# Patient Record
Sex: Male | Born: 1970 | Race: White | Hispanic: No | Marital: Married | Smoking: Never smoker
Health system: Southern US, Community
[De-identification: ages and names within clinical notes are randomized; demographics above are authoritative.]

## PROBLEM LIST (undated history)

## (undated) DIAGNOSIS — F988 Other specified behavioral and emotional disorders with onset usually occurring in childhood and adolescence: Secondary | ICD-10-CM

---

## 2014-01-21 ENCOUNTER — Encounter (HOSPITAL_COMMUNITY): Payer: Self-pay | Admitting: Adult Health

## 2014-01-21 ENCOUNTER — Inpatient Hospital Stay (HOSPITAL_COMMUNITY)
Admission: EM | Admit: 2014-01-21 | Discharge: 2014-01-23 | DRG: 084 | Disposition: A | Discharge status: Home / Self Care | Payer: BLUE CROSS/BLUE SHIELD | Attending: Neurosurgery | Admitting: Neurosurgery

## 2014-01-21 ENCOUNTER — Emergency Department (HOSPITAL_COMMUNITY): Payer: BLUE CROSS/BLUE SHIELD

## 2014-01-21 DIAGNOSIS — S02119A Unspecified fracture of occiput, initial encounter for closed fracture: Secondary | ICD-10-CM | POA: Diagnosis present

## 2014-01-21 DIAGNOSIS — S065XAA Traumatic subdural hemorrhage with loss of consciousness status unknown, initial encounter: Secondary | ICD-10-CM | POA: Diagnosis present

## 2014-01-21 DIAGNOSIS — S065X9A Traumatic subdural hemorrhage with loss of consciousness of unspecified duration, initial encounter: Secondary | ICD-10-CM | POA: Diagnosis not present

## 2014-01-21 DIAGNOSIS — W11XXXA Fall on and from ladder, initial encounter: Secondary | ICD-10-CM | POA: Diagnosis present

## 2014-01-21 DIAGNOSIS — F988 Other specified behavioral and emotional disorders with onset usually occurring in childhood and adolescence: Secondary | ICD-10-CM | POA: Diagnosis present

## 2014-01-21 DIAGNOSIS — W19XXXA Unspecified fall, initial encounter: Secondary | ICD-10-CM

## 2014-01-21 HISTORY — DX: Other specified behavioral and emotional disorders with onset usually occurring in childhood and adolescence: F98.8

## 2014-01-21 LAB — COMPREHENSIVE METABOLIC PANEL
ALBUMIN: 3.8 g/dL (ref 3.5–5.2)
ALK PHOS: 57 U/L (ref 39–117)
ALT: 20 U/L (ref 0–53)
AST: 28 U/L (ref 0–37)
Anion gap: 8 (ref 5–15)
BUN: 24 mg/dL — ABNORMAL HIGH (ref 6–23)
CO2: 26 mmol/L (ref 19–32)
CREATININE: 0.96 mg/dL (ref 0.50–1.35)
Calcium: 8.8 mg/dL (ref 8.4–10.5)
Chloride: 105 mEq/L (ref 96–112)
GFR calc non Af Amer: >90 mL/min (ref >90)
Glucose, Bld: 108 mg/dL — ABNORMAL HIGH (ref 70–99)
POTASSIUM: 3.4 mmol/L — AB (ref 3.5–5.1)
SODIUM: 139 mmol/L (ref 135–145)
Total Bilirubin: 0.9 mg/dL (ref 0.3–1.2)
Total Protein: 6.7 g/dL (ref 6.0–8.3)

## 2014-01-21 LAB — CBC WITH DIFFERENTIAL/PLATELET
Basophils Absolute: 0 10*3/uL (ref 0.0–0.1)
Basophils Relative: 0 % (ref 0–1)
EOS ABS: 0.2 10*3/uL (ref 0.0–0.7)
EOS PCT: 2 % (ref 0–5)
HCT: 41.2 % (ref 39.0–52.0)
HEMOGLOBIN: 14.1 g/dL (ref 13.0–17.0)
Lymphocytes Relative: 30 % (ref 12–46)
Lymphs Abs: 3.1 10*3/uL (ref 0.7–4.0)
MCH: 30.7 pg (ref 26.0–34.0)
MCHC: 34.2 g/dL (ref 30.0–36.0)
MCV: 89.8 fL (ref 78.0–100.0)
MONO ABS: 0.7 10*3/uL (ref 0.1–1.0)
MONOS PCT: 7 % (ref 3–12)
Neutro Abs: 6.1 10*3/uL (ref 1.7–7.7)
Neutrophils Relative %: 61 % (ref 43–77)
Platelets: 263 10*3/uL (ref 150–400)
RBC: 4.59 MIL/uL (ref 4.22–5.81)
RDW: 12.7 % (ref 11.5–15.5)
WBC: 10.1 10*3/uL (ref 4.0–10.5)

## 2014-01-21 LAB — I-STAT CG4 LACTIC ACID, ED: LACTIC ACID, VENOUS: 0.91 mmol/L (ref 0.5–2.2)

## 2014-01-21 MED ORDER — HYDROMORPHONE HCL 1 MG/ML IJ SOLN
0.5000 mg | INTRAMUSCULAR | Status: DC | PRN
  Administered 2014-01-22 (×4): 0.5 mg via INTRAVENOUS
  Filled 2014-01-21 (×4): qty 1

## 2014-01-21 MED ORDER — METOCLOPRAMIDE HCL 5 MG/ML IJ SOLN
10.0000 mg | Freq: Once | INTRAMUSCULAR | Status: AC
  Administered 2014-01-21: 10 mg via INTRAVENOUS
  Filled 2014-01-21: qty 2

## 2014-01-21 MED ORDER — ACETAMINOPHEN 325 MG PO TABS: 650.0000 mg | ORAL_TABLET | Freq: Four times a day (QID) | ORAL | Status: DC | PRN

## 2014-01-21 MED ORDER — POTASSIUM CHLORIDE IN NACL 20-0.9 MEQ/L-% IV SOLN
INTRAVENOUS | Status: DC
  Administered 2014-01-21: 23:00:00 via INTRAVENOUS
  Filled 2014-01-21 (×5): qty 1000

## 2014-01-21 MED ORDER — FENTANYL CITRATE 0.05 MG/ML IJ SOLN
25.0000 ug | Freq: Once | INTRAMUSCULAR | Status: AC
  Administered 2014-01-21: 25 ug via INTRAVENOUS
  Filled 2014-01-21: qty 2

## 2014-01-21 MED ORDER — ALUM & MAG HYDROXIDE-SIMETH 200-200-20 MG/5ML PO SUSP: 30.0000 mL | Freq: Four times a day (QID) | ORAL | Status: DC | PRN

## 2014-01-21 MED ORDER — DOCUSATE SODIUM 100 MG PO CAPS
100.0000 mg | ORAL_CAPSULE | Freq: Two times a day (BID) | ORAL | Status: DC
  Filled 2014-01-21 (×5): qty 1

## 2014-01-21 MED ORDER — HYDROCODONE-ACETAMINOPHEN 5-325 MG PO TABS
1.0000 | ORAL_TABLET | ORAL | Status: DC | PRN
  Administered 2014-01-22 – 2014-01-23 (×2): 2 via ORAL
  Filled 2014-01-21 (×2): qty 2

## 2014-01-21 MED ORDER — FENTANYL CITRATE 0.05 MG/ML IJ SOLN
INTRAMUSCULAR | Status: AC
  Filled 2014-01-21: qty 2

## 2014-01-21 MED ORDER — ONDANSETRON HCL 4 MG PO TABS: 4.0000 mg | ORAL_TABLET | Freq: Four times a day (QID) | ORAL | Status: DC | PRN

## 2014-01-21 MED ORDER — LORAZEPAM 2 MG/ML IJ SOLN
1.0000 mg | Freq: Once | INTRAMUSCULAR | Status: AC
  Administered 2014-01-21: 1 mg via INTRAVENOUS
  Filled 2014-01-21: qty 1

## 2014-01-21 MED ORDER — ACETAMINOPHEN 650 MG RE SUPP
650.0000 mg | Freq: Four times a day (QID) | RECTAL | Status: DC | PRN
Start: 2014-01-21 — End: 2014-01-23
  Filled 2014-01-21: qty 1

## 2014-01-21 MED ORDER — ONDANSETRON HCL 4 MG/2ML IJ SOLN
4.0000 mg | Freq: Four times a day (QID) | INTRAMUSCULAR | Status: DC | PRN
  Administered 2014-01-21 – 2014-01-23 (×4): 4 mg via INTRAVENOUS
  Filled 2014-01-21 (×5): qty 2

## 2014-01-21 MED ORDER — FENTANYL CITRATE 0.05 MG/ML IJ SOLN
50.0000 ug | Freq: Once | INTRAMUSCULAR | Status: AC
  Administered 2014-01-21: 50 ug via INTRAVENOUS

## 2014-01-21 NOTE — ED Notes (Signed)
Family at beside. Family given emotional support. 
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
## 2014-01-21 NOTE — ED Notes (Signed)
Presents post fall from 10 feet off a ladder, landed on carpeted concrete, initially confused, GCS of 14, still confused and c/o nausea, emesis x2.

## 2014-01-21 NOTE — H&P (Signed)
Mark Hanson is an 44 y.o. male.   Chief Complaint: Headache nausea vomiting HPI: Patient is a 44 year old gentleman who is on 8 foot stepladder [place] church [place] the legs gave way he fell back landing on the right side of his head on the floor of the church. Positive loss of consciousness for about 2 minutes then awoke confused with nausea and vomiting. Currently the patient's complaining of a headache and photophobia nausea vomiting but denies any numbness and tingling in his arms or his legs.  Past Medical History  Diagnosis Date  . ADD (attention deficit disorder)     History reviewed. No pertinent past surgical history.  History reviewed. No pertinent family history. Social History:  reports that he has never smoked. He does not have any smokeless tobacco history on file. He reports that he does not drink alcohol or use illicit drugs.  Allergies: No Known Allergies   (Not in a hospital admission)  Results for orders placed or performed during the hospital encounter of 01/21/14 (from the past 48 hour(s))  CBC with Differential     Status: None   Collection Time: 01/21/14  7:39 PM  Result Value Ref Range   WBC 10.1 4.0 - 10.5 K/uL   RBC 4.59 4.22 - 5.81 MIL/uL   Hemoglobin 14.1 13.0 - 17.0 g/dL   HCT 41.2 39.0 - 52.0 %   MCV 89.8 78.0 - 100.0 fL   MCH 30.7 26.0 - 34.0 pg   MCHC 34.2 30.0 - 36.0 g/dL   RDW 12.7 11.5 - 15.5 %   Platelets 263 150 - 400 K/uL   Neutrophils Relative % 61 43 - 77 %   Neutro Abs 6.1 1.7 - 7.7 K/uL   Lymphocytes Relative 30 12 - 46 %   Lymphs Abs 3.1 0.7 - 4.0 K/uL   Monocytes Relative 7 3 - 12 %   Monocytes Absolute 0.7 0.1 - 1.0 K/uL   Eosinophils Relative 2 0 - 5 %   Eosinophils Absolute 0.2 0.0 - 0.7 K/uL   Basophils Relative 0 0 - 1 %   Basophils Absolute 0.0 0.0 - 0.1 K/uL  Comprehensive metabolic panel     Status: Abnormal   Collection Time: 01/21/14  7:39 PM  Result Value Ref Range   Sodium 139 135 - 145 mmol/L    Comment: Please note  change in reference range.   Potassium 3.4 (L) 3.5 - 5.1 mmol/L    Comment: Please note change in reference range.   Chloride 105 96 - 112 mEq/L   CO2 26 19 - 32 mmol/L   Glucose, Bld 108 (H) 70 - 99 mg/dL   BUN 24 (H) 6 - 23 mg/dL   Creatinine, Ser 0.96 0.50 - 1.35 mg/dL   Calcium 8.8 8.4 - 10.5 mg/dL   Total Protein 6.7 6.0 - 8.3 g/dL   Albumin 3.8 3.5 - 5.2 g/dL   AST 28 0 - 37 U/L   ALT 20 0 - 53 U/L   Alkaline Phosphatase 57 39 - 117 U/L   Total Bilirubin 0.9 0.3 - 1.2 mg/dL   GFR calc non Af Amer >90 >90 mL/min   GFR calc Af Amer >90 >90 mL/min    Comment: (NOTE) The eGFR has been calculated using the CKD EPI equation. This calculation has not been validated in all clinical situations. eGFR's persistently <90 mL/min signify possible Chronic Kidney Disease.    Anion gap 8 5 - 15  I-Stat CG4 Lactic Acid, ED  Status: None   Collection Time: 01/21/14  7:44 PM  Result Value Ref Range   Lactic Acid, Venous 0.91 0.5 - 2.2 mmol/L   Ct Head Wo Contrast  01/21/2014   CLINICAL DATA:  Status post 10 foot fall off of a ladder. Confusion, nausea and vomiting. Initial encounter.  EXAM: CT HEAD WITHOUT CONTRAST  CT CERVICAL SPINE WITHOUT CONTRAST  TECHNIQUE: Multidetector CT imaging of the head and cervical spine was performed following the standard protocol without intravenous contrast. Multiplanar CT image reconstructions of the cervical spine were also generated.  COMPARISON:  None.  FINDINGS: CT HEAD FINDINGS  The patient has a nondisplaced skull fracture on the right. The fracture is seen inferiorly in the occipital bone and extends into the lambdoid suture which is diastased. The fracture continues cephalad into the right parietal bone. Overlying soft tissue swelling is noted. Small amount of hemorrhage is seen extending from the middle cranial fossa along the left convexities. The CSF space along the left parietal and temporal convexities is effaced. There is no pneumocephalus. No  evidence of infarct, mass lesion, mass effect, midline shift or abnormal extra-axial fluid collection is seen. There is no hydrocephalus.  CT CERVICAL SPINE FINDINGS  No fracture or malalignment of the cervical spine is identified. The patient has degenerative disc disease most notable at C4-5 and C5-6. No epidural hematoma is visualized. Lung apices are clear.  IMPRESSION: The study is positive for a nondisplaced skull fracture on the right extending from the occipital bone into the right parietal bone.  Small extra axial hemorrhage extending from the right middle cranial fossa along the right temporal and parietal convexities. There is no mass effect or midline shift.  No acute abnormality cervical spine.  Degenerative disc disease most notable C4-5 and C5-6.  Critical Value/emergent results were called by telephone at the time of interpretation on 01/21/2014 at 8:50 pm to Dr. Gareth Morgan , who verbally acknowledged these results.   Electronically Signed   By: Inge Rise M.D.   On: 01/21/2014 20:53   Ct Cervical Spine Wo Contrast  01/21/2014   CLINICAL DATA:  Status post 10 foot fall off of a ladder. Confusion, nausea and vomiting. Initial encounter.  EXAM: CT HEAD WITHOUT CONTRAST  CT CERVICAL SPINE WITHOUT CONTRAST  TECHNIQUE: Multidetector CT imaging of the head and cervical spine was performed following the standard protocol without intravenous contrast. Multiplanar CT image reconstructions of the cervical spine were also generated.  COMPARISON:  None.  FINDINGS: CT HEAD FINDINGS  The patient has a nondisplaced skull fracture on the right. The fracture is seen inferiorly in the occipital bone and extends into the lambdoid suture which is diastased. The fracture continues cephalad into the right parietal bone. Overlying soft tissue swelling is noted. Small amount of hemorrhage is seen extending from the middle cranial fossa along the left convexities. The CSF space along the left parietal and temporal  convexities is effaced. There is no pneumocephalus. No evidence of infarct, mass lesion, mass effect, midline shift or abnormal extra-axial fluid collection is seen. There is no hydrocephalus.  CT CERVICAL SPINE FINDINGS  No fracture or malalignment of the cervical spine is identified. The patient has degenerative disc disease most notable at C4-5 and C5-6. No epidural hematoma is visualized. Lung apices are clear.  IMPRESSION: The study is positive for a nondisplaced skull fracture on the right extending from the occipital bone into the right parietal bone.  Small extra axial hemorrhage extending from the right middle  cranial fossa along the right temporal and parietal convexities. There is no mass effect or midline shift.  No acute abnormality cervical spine.  Degenerative disc disease most notable C4-5 and C5-6.  Critical Value/emergent results were called by telephone at the time of interpretation on 01/21/2014 at 8:50 pm to Dr. Gareth Morgan , who verbally acknowledged these results.   Electronically Signed   By: Inge Rise M.D.   On: 01/21/2014 20:53   Dg Pelvis Portable  01/21/2014   CLINICAL DATA:  Fall 10 feet from a ladder onto carpeted concrete. Level 2 trauma.  EXAM: PORTABLE PELVIS 1-2 VIEWS  COMPARISON:  None.  FINDINGS: Bilaterally symmetric sclerosis along the sacroiliac joints with mild SI joint narrowing compatible with bilateral sacroiliitis. Clips along the upper scrotum, probably from vasectomy.  No acute bony findings.  IMPRESSION: 1. Bilateral symmetric sacroiliitis. This can sometimes be associated with ankylosing spondylitis or inflammatory bowel disease. 2. No acute findings.   Electronically Signed   By: Sherryl Barters M.D.   On: 01/21/2014 19:51   Dg Chest Portable 1 View  01/21/2014   CLINICAL DATA:  Fall 10 feet from a ladder onto carpeted concrete. Level 2 trauma.  EXAM: PORTABLE CHEST - 1 VIEW  COMPARISON:  None.  FINDINGS: The heart size and mediastinal contours are  within normal limits. Both lungs are clear. The visualized skeletal structures are unremarkable.  IMPRESSION: No active disease.   Electronically Signed   By: Sherryl Barters M.D.   On: 01/21/2014 19:49    Review of Systems  Unable to perform ROS   Blood pressure 107/43, pulse 77, temperature 97.6 F (36.4 C), resp. rate 19, SpO2 95 %. Physical Exam  Constitutional: He appears well-developed and well-nourished. He appears lethargic.  HENT:  Head: Normocephalic.  Eyes: Pupils are equal, round, and reactive to light.  Neck: Normal range of motion.  Cardiovascular: Normal rate.   Respiratory: Effort normal.  GI: Soft.  Neurological: He has normal strength. He appears lethargic. GCS eye subscore is 3. GCS verbal subscore is 4. GCS motor subscore is 6.  Patient is lethargic and arousable but oriented 3 pupils are equal and reactive extraocular movements are intact cranial nerves are otherwise intact strength is 5 out of 5 in his upper and lower extremities with no sensory loss reflexes are normal and symmetric  Skin: Skin is warm and dry.     Assessment/Plan 44 year old gentleman with a closed head injury right parietal occipital skull fracture very small amount of extra-axial blood on the left with a contrecoup contusion in for left temporal there is some effacement of the sulci on the left all contralateral to his skull fracture on the right and therefore motor more than likely contrecoup injuries. Patient is significantly postconcussive. We will put in the ICU for observation on IV fluids keep him nothing by mouth repeat CT in the morning. We'll continue c-collar until the patient's more awake to clear.  Louie Flenner P 01/21/2014, 9:42 PM

## 2014-01-22 ENCOUNTER — Observation Stay (HOSPITAL_COMMUNITY): Payer: BLUE CROSS/BLUE SHIELD

## 2014-01-22 ENCOUNTER — Encounter (HOSPITAL_COMMUNITY): Payer: Self-pay | Admitting: Radiology

## 2014-01-22 DIAGNOSIS — F988 Other specified behavioral and emotional disorders with onset usually occurring in childhood and adolescence: Secondary | ICD-10-CM | POA: Diagnosis present

## 2014-01-22 DIAGNOSIS — S02119A Unspecified fracture of occiput, initial encounter for closed fracture: Secondary | ICD-10-CM | POA: Diagnosis present

## 2014-01-22 DIAGNOSIS — S065X9A Traumatic subdural hemorrhage with loss of consciousness of unspecified duration, initial encounter: Secondary | ICD-10-CM | POA: Diagnosis present

## 2014-01-22 DIAGNOSIS — W11XXXA Fall on and from ladder, initial encounter: Secondary | ICD-10-CM | POA: Diagnosis present

## 2014-01-22 LAB — URINALYSIS, ROUTINE W REFLEX MICROSCOPIC
Bilirubin Urine: NEGATIVE
Glucose, UA: NEGATIVE mg/dL
Hgb urine dipstick: NEGATIVE
Ketones, ur: >80 mg/dL — AB
LEUKOCYTES UA: NEGATIVE
NITRITE: NEGATIVE
Protein, ur: NEGATIVE mg/dL
SPECIFIC GRAVITY, URINE: 1.027 (ref 1.005–1.030)
UROBILINOGEN UA: 0.2 mg/dL (ref 0.0–1.0)
pH: 6 (ref 5.0–8.0)

## 2014-01-22 LAB — MRSA PCR SCREENING: MRSA BY PCR: NEGATIVE

## 2014-01-22 MED ORDER — DEXAMETHASONE SODIUM PHOSPHATE 4 MG/ML IJ SOLN
8.0000 mg | Freq: Four times a day (QID) | INTRAMUSCULAR | Status: DC
  Administered 2014-01-22 – 2014-01-23 (×6): 8 mg via INTRAVENOUS
  Filled 2014-01-22 (×9): qty 2

## 2014-01-22 NOTE — Progress Notes (Signed)
Patient ID: Mark Hanson, male   DOB: 01/12/1971, 44 y.o.   MRN: [id number] Patient unchanged with headache nausea and vomiting  Neurologically intact and nonfocal somnolent but easily arousable  Head CT stable continue observation the ICU for

## 2014-01-22 NOTE — Progress Notes (Signed)
UR completed 

## 2014-01-22 NOTE — Progress Notes (Signed)
Chaplain responded to ED for level two trauma of a fall from ladder.  Chaplain was unable to see patient at time of arrival to hospital due to his receiving medical intervention by the staff.  Upon follow-up was able to meet patient and family members to offer pastoral support and encouragement.  Patient was transferred to [facility] as referred from a medical consult. Chaplain will continue to follow-up with patient and family for support and Unit Chaplain will be informed of patients admission. On-Call Chaplain [phone]

## 2014-01-22 NOTE — ED Provider Notes (Signed)
CSN: [id number]     Arrival date & time 01/21/14  1924 History   First MD Initiated Contact with Patient 01/21/14 1954     Chief Complaint  Patient presents with  . Fall     (Consider location/radiation/quality/duration/timing/severity/associated sxs/prior Treatment) HPI Comments: Tripped and fell off ladder 10 feet, head impact onto carpeted cement floor per bystander, LOC for .  Patient is a 44 y.o. male presenting with fall.  Fall This is a new problem. The current episode started today. The problem has been gradually improving. Associated symptoms include headaches, nausea and vomiting. Pertinent negatives include no abdominal pain, chest pain, congestion, coughing, fever, neck pain, rash, sore throat or visual change. Nothing aggravates the symptoms. He has tried nothing for the symptoms. The treatment provided no relief.    Past Medical History  Diagnosis Date  . ADD (attention deficit disorder)    History reviewed. No pertinent past surgical history. History reviewed. No pertinent family history. History  Substance Use Topics  . Smoking status: Never Smoker   . Smokeless tobacco: Not on file  . Alcohol Use: No    Review of Systems  Constitutional: Negative for fever.  HENT: Negative for congestion and sore throat.   Eyes: Negative for visual disturbance.  Respiratory: Negative for cough and shortness of breath.   Cardiovascular: Negative for chest pain.  Gastrointestinal: Positive for nausea and vomiting. Negative for abdominal pain.  Genitourinary: Negative for difficulty urinating.  Musculoskeletal: Negative for back pain, neck pain and neck stiffness.  Skin: Negative for rash.  Neurological: Positive for headaches. Negative for syncope.      Allergies  Review of patient's allergies indicates no known allergies.  Home Medications   Prior to Admission medications   Medication Sig Start Date End Date Taking? Authorizing Provider  Glucosamine HCl  (GLUCOSAMINE PO) Take 2 tablets by mouth daily.   Yes Historical Provider, MD  Omega-3 Fatty Acids (FISH OIL PO) Take 2 capsules by mouth daily.   Yes Historical Provider, MD  sertraline (ZOLOFT) 50 MG tablet Take 50 mg by mouth daily. 01/10/14  Yes Historical Provider, MD   BP 127/70 mmHg  Pulse 75  Temp(Src) 97.9 F (36.6 C) (Oral)  Resp 17  SpO2 97% Physical Exam  Constitutional: He is oriented to person, place, and time. He appears well-developed and well-nourished. No distress. Cervical collar and backboard in place.  HENT:  Head: Normocephalic. Head is with abrasion. Head is without raccoon's eyes.  Right Ear: External ear normal. No hemotympanum.  Left Ear: External ear normal. No hemotympanum.  Nose: No nasal septal hematoma. No epistaxis.  Mouth/Throat: Normal dentition.  Small abrasion to his forehead  Eyes: Conjunctivae and EOM are normal. Pupils are equal, round, and reactive to light.  Neck: Normal range of motion.  Cardiovascular: Normal rate, regular rhythm, normal heart sounds and intact distal pulses.  Exam reveals no gallop and no friction rub.   No murmur heard. Pulmonary/Chest: Effort normal and breath sounds normal. No respiratory distress. He has no wheezes. He has no rales. He exhibits no tenderness.  Abdominal: Soft. He exhibits no distension. There is no tenderness. There is no guarding.  Musculoskeletal: He exhibits no edema or tenderness.       Right hip: He exhibits normal strength.       Left hip: He exhibits normal strength.       Cervical back: He exhibits no bony tenderness.       Thoracic back: He exhibits no bony tenderness.  Lumbar back: He exhibits no bony tenderness.  Neurological: He is alert and oriented to person, place, and time.  Skin: Skin is warm and dry. He is not diaphoretic.  Nursing note and vitals reviewed.   ED Course  Procedures (including critical care time) Labs Review Labs Reviewed  COMPREHENSIVE METABOLIC PANEL -  Abnormal; Notable for the following:    Potassium 3.4 (*)    Glucose, Bld 108 (*)    BUN 24 (*)    All other components within normal limits  URINALYSIS, ROUTINE W REFLEX MICROSCOPIC - Abnormal; Notable for the following:    Ketones, ur >80 (*)    All other components within normal limits  MRSA PCR SCREENING  CBC WITH DIFFERENTIAL  I-STAT CG4 LACTIC ACID, ED    Imaging Review Ct Head Wo Contrast  01/21/2014   CLINICAL DATA:  Status post 10 foot fall off of a ladder. Confusion, nausea and vomiting. Initial encounter.  EXAM: CT HEAD WITHOUT CONTRAST  CT CERVICAL SPINE WITHOUT CONTRAST  TECHNIQUE: Multidetector CT imaging of the head and cervical spine was performed following the standard protocol without intravenous contrast. Multiplanar CT image reconstructions of the cervical spine were also generated.  COMPARISON:  None.  FINDINGS: CT HEAD FINDINGS  The patient has a nondisplaced skull fracture on the right. The fracture is seen inferiorly in the occipital bone and extends into the lambdoid suture which is diastased. The fracture continues cephalad into the right parietal bone. Overlying soft tissue swelling is noted. Small amount of hemorrhage is seen extending from the middle cranial fossa along the left convexities. The CSF space along the left parietal and temporal convexities is effaced. There is no pneumocephalus. No evidence of infarct, mass lesion, mass effect, midline shift or abnormal extra-axial fluid collection is seen. There is no hydrocephalus.  CT CERVICAL SPINE FINDINGS  No fracture or malalignment of the cervical spine is identified. The patient has degenerative disc disease most notable at C4-5 and C5-6. No epidural hematoma is visualized. Lung apices are clear.  IMPRESSION: The study is positive for a nondisplaced skull fracture on the right extending from the occipital bone into the right parietal bone.  Small extra axial hemorrhage extending from the right middle cranial fossa  along the right temporal and parietal convexities. There is no mass effect or midline shift.  No acute abnormality cervical spine.  Degenerative disc disease most notable C4-5 and C5-6.  Critical Value/emergent results were called by telephone at the time of interpretation on 01/21/2014 at 8:50 pm to Dr. Alvira Monday , who verbally acknowledged these results.   Electronically Signed   By: Drusilla Kanner M.D.   On: 01/21/2014 20:53   Ct Cervical Spine Wo Contrast  01/21/2014   CLINICAL DATA:  Status post 10 foot fall off of a ladder. Confusion, nausea and vomiting. Initial encounter.  EXAM: CT HEAD WITHOUT CONTRAST  CT CERVICAL SPINE WITHOUT CONTRAST  TECHNIQUE: Multidetector CT imaging of the head and cervical spine was performed following the standard protocol without intravenous contrast. Multiplanar CT image reconstructions of the cervical spine were also generated.  COMPARISON:  None.  FINDINGS: CT HEAD FINDINGS  The patient has a nondisplaced skull fracture on the right. The fracture is seen inferiorly in the occipital bone and extends into the lambdoid suture which is diastased. The fracture continues cephalad into the right parietal bone. Overlying soft tissue swelling is noted. Small amount of hemorrhage is seen extending from the middle cranial fossa along the left convexities.  The CSF space along the left parietal and temporal convexities is effaced. There is no pneumocephalus. No evidence of infarct, mass lesion, mass effect, midline shift or abnormal extra-axial fluid collection is seen. There is no hydrocephalus.  CT CERVICAL SPINE FINDINGS  No fracture or malalignment of the cervical spine is identified. The patient has degenerative disc disease most notable at C4-5 and C5-6. No epidural hematoma is visualized. Lung apices are clear.  IMPRESSION: The study is positive for a nondisplaced skull fracture on the right extending from the occipital bone into the right parietal bone.  Small extra axial  hemorrhage extending from the right middle cranial fossa along the right temporal and parietal convexities. There is no mass effect or midline shift.  No acute abnormality cervical spine.  Degenerative disc disease most notable C4-5 and C5-6.  Critical Value/emergent results were called by telephone at the time of interpretation on 01/21/2014 at 8:50 pm to Dr. Alvira MondayERIN Jaedyn Marrufo , who verbally acknowledged these results.   Electronically Signed   By: Drusilla Kannerhomas  Dalessio M.D.   On: 01/21/2014 20:53   Dg Pelvis Portable  01/21/2014   CLINICAL DATA:  Fall 10 feet from a ladder onto carpeted concrete. Level 2 trauma.  EXAM: PORTABLE PELVIS 1-2 VIEWS  COMPARISON:  None.  FINDINGS: Bilaterally symmetric sclerosis along the sacroiliac joints with mild SI joint narrowing compatible with bilateral sacroiliitis. Clips along the upper scrotum, probably from vasectomy.  No acute bony findings.  IMPRESSION: 1. Bilateral symmetric sacroiliitis. This can sometimes be associated with ankylosing spondylitis or inflammatory bowel disease. 2. No acute findings.   Electronically Signed   By: Herbie BaltimoreWalt  Liebkemann M.D.   On: 01/21/2014 19:51   Dg Chest Portable 1 View  01/21/2014   CLINICAL DATA:  Fall 10 feet from a ladder onto carpeted concrete. Level 2 trauma.  EXAM: PORTABLE CHEST - 1 VIEW  COMPARISON:  None.  FINDINGS: The heart size and mediastinal contours are within normal limits. Both lungs are clear. The visualized skeletal structures are unremarkable.  IMPRESSION: No active disease.   Electronically Signed   By: Herbie BaltimoreWalt  Liebkemann M.D.   On: 01/21/2014 19:49     EKG Interpretation None      MDM   Final diagnoses:  Fall  SDH (subdural hematoma)   44 year old male with no significant medical history presents as a Level II Trauma for concern of fall from 10 foot ladder with LOC.  Per bystanders pt had 5min LOC, and with EMS was GCS 14 and nauseas.   Per history provided by witness, he fell directly onto right side of head and  had little impact with other parts of body.  Patient GCS of 15 on arrival to the ED, however concerned regarding headache.  Airway/breathing intact and VS WNL.  Portable CXR and pelvis without signs of fracture.  Have low suspicion for intrathoracic, intraabdominal, pelvic or intraspinal injury given history and examination.  Labs also do not indicate other injuries.w  Given mechanism and LOC CT Head and Cervical Spine were ordered which were significant for a skull fracture and small amount of axial hemorrhage.  Neurosurgery was consulted and saw pt.  He was given zofran, reglan, ativan for continued nausea/vomiting.  He was given fentanyl for pain.     Rhae LernerErin Elizabeth Othell Diluzio, MD 01/22/14 0319  Merrie RoofJohn David Wofford III, MD 01/24/14 [phone]

## 2014-01-22 NOTE — ED Provider Notes (Signed)
I saw and evaluated the patient, reviewed the resident's note and I agree with the findings and plan.   EKG Interpretation None      CRITICAL CARE Performed by: Merrie RoofWOFFORD III, Carnel Stegman David   Total critical care time: 30   Critical care time was exclusive of separately billable procedures and treating other patients.  Critical care was necessary to treat or prevent imminent or life-threatening deterioration.  Critical care was time spent personally by me on the following activities: development of treatment plan with patient and/or surrogate as well as nursing, discussions with consultants, evaluation of patient's response to treatment, examination of patient, obtaining history from patient or surrogate, ordering and performing treatments and interventions, ordering and review of laboratory studies, ordering and review of radiographic studies, pulse oximetry and re-evaluation of patient's condition.   44 year old male presenting as a level II trauma code after he fell from height striking his head or losing consciousness.  On exam, alert, on backboard with c-collar, slow to answer questions, but oriented, lungs clear to auscultation bilaterally, heart sounds normal with regular rate and rhythm.  Imaging showed nondisplaced skull fracture and small amount of extra-axial hemorrhage. No other injuries identified by history or exam. Admitted by neurosurgery.  Clinical Impression: 1. Fall   2. SDH (subdural hematoma)    skull fracture    Candyce ChurnJohn David Muadh Creasy III, MD 01/22/14 [phone]

## 2014-01-23 MED ORDER — PROMETHAZINE HCL 12.5 MG PO TABS: 12.5000 mg | ORAL_TABLET | Freq: Four times a day (QID) | ORAL | Status: DC | PRN

## 2014-01-23 MED ORDER — HYDROCODONE-ACETAMINOPHEN 5-325 MG PO TABS
1.0000 | ORAL_TABLET | ORAL | Status: DC | PRN
Start: 2014-01-23 — End: 2018-07-02

## 2014-01-23 MED ORDER — PROMETHAZINE HCL 25 MG PO TABS
12.5000 mg | ORAL_TABLET | Freq: Four times a day (QID) | ORAL | Status: DC | PRN
  Administered 2014-01-23: 12.5 mg via ORAL
  Filled 2014-01-23: qty 1

## 2014-01-23 NOTE — Discharge Instructions (Signed)
Avoid all physical activity no lifting no driving remain out of work 1 week until follow-up. Minimize television watching no videogames minimize social media

## 2014-01-23 NOTE — Progress Notes (Signed)
Patient ID: Mark Hanson Situ, male   DOB: 10/18/1970, 44 y.o.   MRN: [id number] Patient doing much better no nausea no vomiting minimal headache  Awake alert oriented strength out of 5  Continue to observe make sure he tolerates (morning endplates around the unit can be discharged later today

## 2014-01-23 NOTE — Discharge Summary (Signed)
  Physician Discharge Summary  Patient ID: Mark Hanson MRN: [id number] DOB/AGE: 45/01/1970 45 y.o.  Admit date: 01/21/2014 Discharge date: 01/23/2014  Admission Diagnoses: Closed head injury concussion  Discharge Diagnoses: Samegood Active Problems:   SDH (subdural hematoma)   Discharged Condition: good  Hospital Course: Patient admitted hospital underwent a CT scan that showed skull fracture and contrecoup small subdural hematoma and hemorrhagic contusion. Patient was observed in the ICU and patient was stable for discharge home on hospital day 2 as long as he tolerates is by mouth breakfast and lunch as long as he keeps that down and ambulates around the unit  Consults: Significant Diagnostic Studies: Treatments: Discharge Exam: Blood pressure 108/48, pulse 52, temperature 98.2 F (36.8 C), temperature source Oral, resp. rate 16, SpO2 98 %. Awake alert oriented pupils equal strength 5out of 5  Disposition: Home     Medication List    TAKE these medications        FISH OIL PO  Take 2 capsules by mouth daily.     GLUCOSAMINE PO  Take 2 tablets by mouth daily.     HYDROcodone-acetaminophen 5-325 MG per tablet  Commonly known as:  NORCO/VICODIN  Take 1-2 tablets by mouth every 4 (four) hours as needed for moderate pain.     sertraline 50 MG tablet  Commonly known as:  ZOLOFT  Take 50 mg by mouth daily.           Follow-up Information    [facility]amez Arrona P, MD.   Specialty:  Neurosurgery   Contact information:   [address] [place] [address] [phone]       Signed: Mariam DollarCRAM,Halla Chopp P 01/23/2014, 7:51 AM

## 2014-01-27 ENCOUNTER — Emergency Department (HOSPITAL_COMMUNITY): Payer: BLUE CROSS/BLUE SHIELD

## 2014-01-27 ENCOUNTER — Emergency Department (HOSPITAL_COMMUNITY)
Admission: EM | Admit: 2014-01-27 | Discharge: 2014-01-27 | Disposition: A | Discharge status: Home / Self Care | Payer: BLUE CROSS/BLUE SHIELD | Attending: Emergency Medicine | Admitting: Emergency Medicine

## 2014-01-27 ENCOUNTER — Encounter (HOSPITAL_COMMUNITY): Payer: Self-pay | Admitting: Emergency Medicine

## 2014-01-27 DIAGNOSIS — Z79899 Other long term (current) drug therapy: Secondary | ICD-10-CM | POA: Insufficient documentation

## 2014-01-27 DIAGNOSIS — S065XAA Traumatic subdural hemorrhage with loss of consciousness status unknown, initial encounter: Secondary | ICD-10-CM

## 2014-01-27 DIAGNOSIS — W11XXXD Fall on and from ladder, subsequent encounter: Secondary | ICD-10-CM | POA: Diagnosis not present

## 2014-01-27 DIAGNOSIS — R112 Nausea with vomiting, unspecified: Secondary | ICD-10-CM | POA: Diagnosis not present

## 2014-01-27 DIAGNOSIS — R42 Dizziness and giddiness: Secondary | ICD-10-CM | POA: Diagnosis not present

## 2014-01-27 DIAGNOSIS — R51 Headache: Secondary | ICD-10-CM | POA: Diagnosis present

## 2014-01-27 DIAGNOSIS — T1490XA Injury, unspecified, initial encounter: Secondary | ICD-10-CM

## 2014-01-27 DIAGNOSIS — S065X9A Traumatic subdural hemorrhage with loss of consciousness of unspecified duration, initial encounter: Secondary | ICD-10-CM

## 2014-01-27 DIAGNOSIS — Z8659 Personal history of other mental and behavioral disorders: Secondary | ICD-10-CM | POA: Insufficient documentation

## 2014-01-27 DIAGNOSIS — S065X0D Traumatic subdural hemorrhage without loss of consciousness, subsequent encounter: Secondary | ICD-10-CM | POA: Diagnosis not present

## 2014-01-27 LAB — CBC
HEMATOCRIT: 50.1 % (ref 39.0–52.0)
Hemoglobin: 17.6 g/dL — ABNORMAL HIGH (ref 13.0–17.0)
MCH: 32.4 pg (ref 26.0–34.0)
MCHC: 35.1 g/dL (ref 30.0–36.0)
MCV: 92.3 fL (ref 78.0–100.0)
Platelets: 268 10*3/uL (ref 150–400)
RBC: 5.43 MIL/uL (ref 4.22–5.81)
RDW: 12.7 % (ref 11.5–15.5)
WBC: 11.8 10*3/uL — ABNORMAL HIGH (ref 4.0–10.5)

## 2014-01-27 LAB — BASIC METABOLIC PANEL
Anion gap: 9 (ref 5–15)
BUN: 20 mg/dL (ref 6–23)
CHLORIDE: 97 meq/L (ref 96–112)
CO2: 30 mmol/L (ref 19–32)
Calcium: 9.3 mg/dL (ref 8.4–10.5)
Creatinine, Ser: 0.88 mg/dL (ref 0.50–1.35)
GFR calc Af Amer: >90 mL/min (ref >90)
GFR calc non Af Amer: >90 mL/min (ref >90)
Glucose, Bld: 120 mg/dL — ABNORMAL HIGH (ref 70–99)
Potassium: 4 mmol/L (ref 3.5–5.1)
SODIUM: 136 mmol/L (ref 135–145)

## 2014-01-27 MED ORDER — METOCLOPRAMIDE HCL 5 MG/ML IJ SOLN
10.0000 mg | Freq: Once | INTRAMUSCULAR | Status: AC
  Administered 2014-01-27: 10 mg via INTRAVENOUS
  Filled 2014-01-27: qty 2

## 2014-01-27 MED ORDER — SODIUM CHLORIDE 0.9 % IV SOLN: Freq: Once | INTRAVENOUS | Status: DC

## 2014-01-27 MED ORDER — ONDANSETRON HCL 4 MG PO TABS: 4.0000 mg | ORAL_TABLET | Freq: Four times a day (QID) | ORAL | Status: DC

## 2014-01-27 MED ORDER — ONDANSETRON HCL 4 MG/2ML IJ SOLN
4.0000 mg | Freq: Once | INTRAMUSCULAR | Status: AC
  Administered 2014-01-27: 4 mg via INTRAVENOUS
  Filled 2014-01-27: qty 2

## 2014-01-27 MED ORDER — SODIUM CHLORIDE 0.9 % IV BOLUS (SEPSIS)
1000.0000 mL | Freq: Once | INTRAVENOUS | Status: AC
  Administered 2014-01-27: 1000 mL via INTRAVENOUS

## 2014-01-27 MED ORDER — MORPHINE SULFATE 4 MG/ML IJ SOLN
4.0000 mg | Freq: Once | INTRAMUSCULAR | Status: AC
  Administered 2014-01-27: 4 mg via INTRAVENOUS
  Filled 2014-01-27: qty 1

## 2014-01-27 MED ORDER — ONDANSETRON HCL 4 MG/2ML IJ SOLN
4.0000 mg | Freq: Once | INTRAMUSCULAR | Status: AC
Start: 2014-01-27 — End: 2014-01-27
  Administered 2014-01-27: 4 mg via INTRAVENOUS
  Filled 2014-01-27: qty 2

## 2014-01-27 MED ORDER — DEXAMETHASONE SODIUM PHOSPHATE 10 MG/ML IJ SOLN
10.0000 mg | Freq: Once | INTRAMUSCULAR | Status: AC
  Administered 2014-01-27: 10 mg via INTRAVENOUS
  Filled 2014-01-27: qty 1

## 2014-01-27 MED ORDER — DIPHENHYDRAMINE HCL 50 MG/ML IJ SOLN
25.0000 mg | Freq: Once | INTRAMUSCULAR | Status: AC
  Administered 2014-01-27: 25 mg via INTRAVENOUS
  Filled 2014-01-27: qty 1

## 2014-01-27 NOTE — ED Notes (Signed)
MD at bedside. 

## 2014-01-27 NOTE — ED Notes (Signed)
Pt complaining of increased pain

## 2014-01-27 NOTE — ED Provider Notes (Signed)
Patient is a 44 year old male who presents to the hospital after being discharged, diagnosed with subdural hematoma as well as intracerebral hematoma and contusions. He was discharged from the hospital several days ago, he has had persistent and worsening nausea and vomiting since that time. He feels persistently lightheaded. No seizures. He was sent to the hospital by his neurosurgeon today with recommendation of a repeat CT scan of the brain. On exam the patient is able to speak, follow commands, has normal pupils, no asymetry, no anisocoria, normal EOM, normal grips, normal sensation.  he appears uncomfortable and nauseated but is neurologically intact.  Sent by NS for CT of head and labs, symtpomatic meds.  CT unchanged, NS agrees with d/c and symptomatic control, pt and fmaily in agreement.  I saw and evaluated the patient, reviewed the resident's note and I agree with the findings and plan.   Final diagnoses:  Trauma  SDH (subdural hematoma)  Non-intractable vomiting with nausea, vomiting of unspecified type      Vida RollerBrian D Javaya [place], MD 01/28/14 1513

## 2014-01-27 NOTE — ED Notes (Signed)
Patient transported to CT 

## 2014-01-27 NOTE — ED Provider Notes (Signed)
CSN: [id number]     Arrival date & time 01/27/14  0932 History   First MD Initiated Contact with Patient 01/27/14 [phone]     Chief Complaint  Patient presents with  . Headache     (Consider location/radiation/quality/duration/timing/severity/associated sxs/prior Treatment) Patient is a 44 y.o. male presenting with vomiting. The history is provided by the patient and the spouse.  Emesis Severity:  Moderate Duration:  3 days Timing:  Constant Quality:  Undigested food Progression:  Unchanged Chronicity:  New Recent urination:  Normal Context comment:  Patient fell and suffered SDH and ICH 6 days ago Relieved by:  Nothing Exacerbated by: movement, Ineffective treatments:  None tried Associated symptoms: headaches   Associated symptoms: no abdominal pain, no diarrhea, no fever, no myalgias and no sore throat     Past Medical History  Diagnosis Date  . ADD (attention deficit disorder)    History reviewed. No pertinent past surgical history. History reviewed. No pertinent family history. History  Substance Use Topics  . Smoking status: Never Smoker   . Smokeless tobacco: Not on file  . Alcohol Use: No    Review of Systems  Constitutional: Negative for fever, diaphoresis, activity change and appetite change.  HENT: Negative for facial swelling, sore throat, tinnitus, trouble swallowing and voice change.   Eyes: Negative for pain, redness and visual disturbance.  Respiratory: Negative for chest tightness, shortness of breath and wheezing.   Cardiovascular: Negative for chest pain, palpitations and leg swelling.  Gastrointestinal: Positive for vomiting. Negative for nausea, abdominal pain, diarrhea, constipation and abdominal distention.  Endocrine: Negative.   Genitourinary: Negative.  Negative for dysuria, decreased urine volume, scrotal swelling and testicular pain.  Musculoskeletal: Negative for myalgias, back pain and gait problem.  Skin: Negative.  Negative for rash.   Neurological: Positive for dizziness and headaches. Negative for tremors and weakness.  Psychiatric/Behavioral: Negative for suicidal ideas, hallucinations and self-injury. The patient is not nervous/anxious.       Allergies  Review of patient's allergies indicates no known allergies.  Home Medications   Prior to Admission medications   Medication Sig Start Date End Date Taking? Authorizing Provider  Glucosamine HCl (GLUCOSAMINE PO) Take 2 tablets by mouth daily.   Yes Historical Provider, MD  HYDROcodone-acetaminophen (NORCO/VICODIN) 5-325 MG per tablet Take 1-2 tablets by mouth every 4 (four) hours as needed for moderate pain. 01/23/14  Yes Mariam Dollar, MD  Omega-3 Fatty Acids (FISH OIL PO) Take 2 capsules by mouth daily.   Yes Historical Provider, MD  promethazine (PHENERGAN) 12.5 MG tablet Take 1 tablet (12.5 mg total) by mouth every 6 (six) hours as needed for nausea or vomiting. 01/23/14  Yes Mariam Dollar, MD  sertraline (ZOLOFT) 50 MG tablet Take 50 mg by mouth daily. 01/10/14  Yes Historical Provider, MD  ondansetron (ZOFRAN) 4 MG tablet Take 1 tablet (4 mg total) by mouth every 6 (six) hours. 01/27/14   Lula Olszewski, MD  promethazine (PHENERGAN) 12.5 MG tablet Take 1 tablet (12.5 mg total) by mouth every 6 (six) hours as needed for nausea or vomiting. Patient not taking: Reported on 01/27/2014 01/23/14   Mariam Dollar, MD   BP 108/52 mmHg  Pulse 55  Resp 17  SpO2 100% Physical Exam  Constitutional: He is oriented to person, place, and time. He appears well-developed and well-nourished. No distress.  HENT:  Head: Normocephalic and atraumatic.  Right Ear: External ear normal.  Left Ear: External ear normal.  Nose: Nose normal.  Mouth/Throat:  Oropharynx is clear and moist.  Eyes: Conjunctivae and EOM are normal. Pupils are equal, round, and reactive to light. No scleral icterus.  Neck: Normal range of motion. Neck supple. No JVD present. No tracheal deviation present. No thyromegaly  present.  Cardiovascular: Normal rate and intact distal pulses.  Exam reveals no gallop and no friction rub.   No murmur heard. Pulmonary/Chest: Effort normal and breath sounds normal. No stridor. No respiratory distress. He has no wheezes. He has no rales.  Abdominal: Soft. He exhibits no distension. There is no tenderness. There is no rebound and no guarding.  Musculoskeletal: Normal range of motion. He exhibits no edema or tenderness.  Neurological: He is alert and oriented to person, place, and time. No cranial nerve deficit. He exhibits normal muscle tone. Coordination normal.  5/5 strength in all 4 extremities. Sensation intact and normal in all 4 extremities. Patient able to ambulate on own power for long distances with short shuffling gait. Normal finger to nose and heel to shin. Negative romberg.    Skin: Skin is warm and dry. No rash noted. He is not diaphoretic.  Psychiatric: He has a normal mood and affect. His behavior is normal.  Nursing note and vitals reviewed.   ED Course  Procedures (including critical care time) Labs Review Labs Reviewed  BASIC METABOLIC PANEL - Abnormal; Notable for the following:    Glucose, Bld 120 (*)    All other components within normal limits  CBC - Abnormal; Notable for the following:    WBC 11.8 (*)    Hemoglobin 17.6 (*)    All other components within normal limits    Imaging Review Ct Head Wo Contrast  01/27/2014   CLINICAL DATA:  Status post 8 foot fall from a ladder is 01/21/2014 with a skull fracture. Worsening neurologic symptoms.  EXAM: CT HEAD WITHOUT CONTRAST  TECHNIQUE: Contiguous axial images were obtained from the base of the skull through the vertex without intravenous contrast.  COMPARISON:  Head CT scan 01/21/2014 and 01/22/2014.  FINDINGS: There has been expected evolutionary change in hemorrhagic contusions in the left frontal lobe which demonstrates decreased attenuation. Very small subdural hemorrhage over the left  frontoparietal convexities also demonstrates evolutionary change with decreased attenuation seen. The collection continues to measure approximately 0.4 cm. Small foci of subarachnoid hemorrhage on the left are not visible today. There is no midline shift, hydrocephalus or intraventricular hemorrhage. No mass or evidence of acute infarction is identified. Nondisplaced skull fractures on the right are again seen.  IMPRESSION: No new abnormality since the prior examinations. Expected evolutionary change of hemorrhagic contusions in the left frontal lobe, small amount of subarachnoid hemorrhage on the left and small left subdural hematoma is seen without associated midline shift.   Electronically Signed   By: Drusilla Kanner M.D.   On: 01/27/2014 10:08     EKG Interpretation None      MDM   Final diagnoses:  Trauma  SDH (subdural hematoma)  Non-intractable vomiting with nausea, vomiting of unspecified type    The patient is a 44 year old male who 6 days ago suffered a subdural hematoma and intracranial hemorrhage after a fall, had a brief Neuro ICU stay discharged 4 days, he presents for 3 days of worsening vomiting headache dizziness. Non-con head CT shows expected evolutionary changes of hemorrhagic contusions with no new abnormalities from prior examinations. The case is discussed with the patients neurosurgeon, Dr. Wynetta Emery, who has reviewed the patient's head imaging from today and reports no  acute neurosurgical intervention is required. Dr. Wynetta Emeryram will follow-up the patient in clinic in 2 days. Patient is given IV fluids and nausea medications in the emergency department with significant improvement of his symptoms and is able to tolerate by mouth while in the ED. I feel patient is appropriate for discharge home with symptomatic control with Zofran, strict ED return precautions for inability to tolerate food or drink or new or worsening neurologic deficits, and neurosurgery follow-up in 2 days. Patient  and spouse expressed understanding and agreement with this plan and the patient is discharged home.  Patient seen with attending, Dr. Hyacinth MeekerMiller, who oversaw clinical decision making.     Lula OlszewskiMike Quayshaun Hubbert, MD 01/27/14 1429  Vida RollerBrian D Miller, MD 01/28/14 [phone]

## 2014-01-27 NOTE — ED Notes (Signed)
Skull fx wends with worsening neuro symptoms; vomiting; dizziness; pain. PERRLA and nueorologically intact; no slurred speech; gait unsteady.

## 2014-01-27 NOTE — ED Notes (Signed)
PT ambulated with baseline gait; VSS; A&Ox3; no signs of distress; respirations even and unlabored; skin warm and dry; no questions upon discharge.  

## 2014-01-27 NOTE — ED Notes (Signed)
Family at bedside has no needs.

## 2016-05-01 IMAGING — CT CT HEAD W/O CM
2 of 5 series · 13 of 47 positions shown, 16 images · non-contrast
Comparison: None.

CLINICAL DATA: Status post 10 foot fall off of a ladder. Confusion,
nausea and vomiting. Initial encounter.

EXAM:
CT HEAD WITHOUT CONTRAST
CT CERVICAL SPINE WITHOUT CONTRAST
TECHNIQUE: Multidetector CT imaging of the head and cervical spine was
performed following the standard protocol without intravenous
contrast. Multiplanar CT image reconstructions of the cervical spine
were also generated.

[Series 7: coronals · coronal · 0.23mm/px · 3 of 45 slices shown]
[im 15/45  brain]
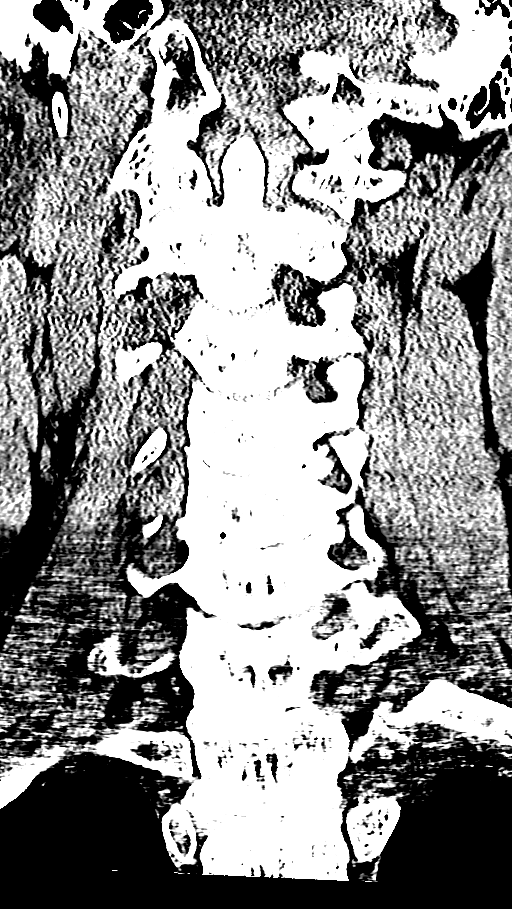
[im 20/45  brain]
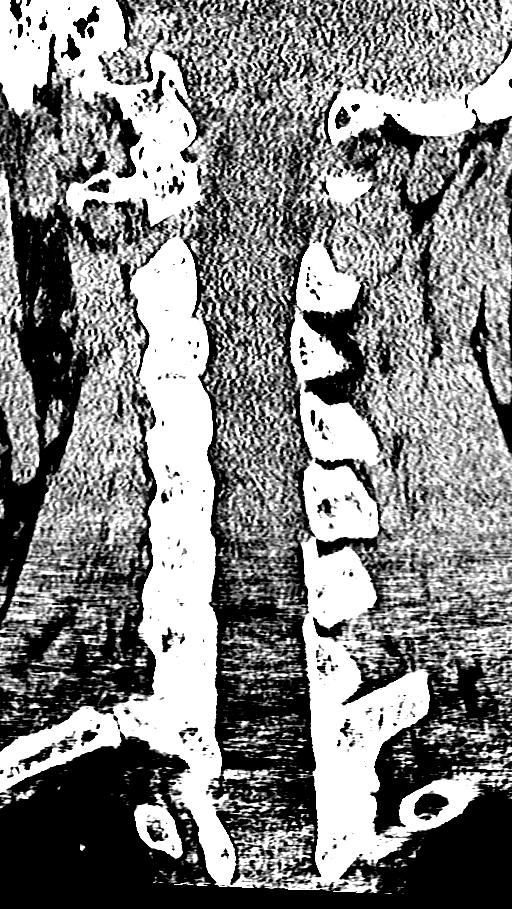
[im 25/45  brain]
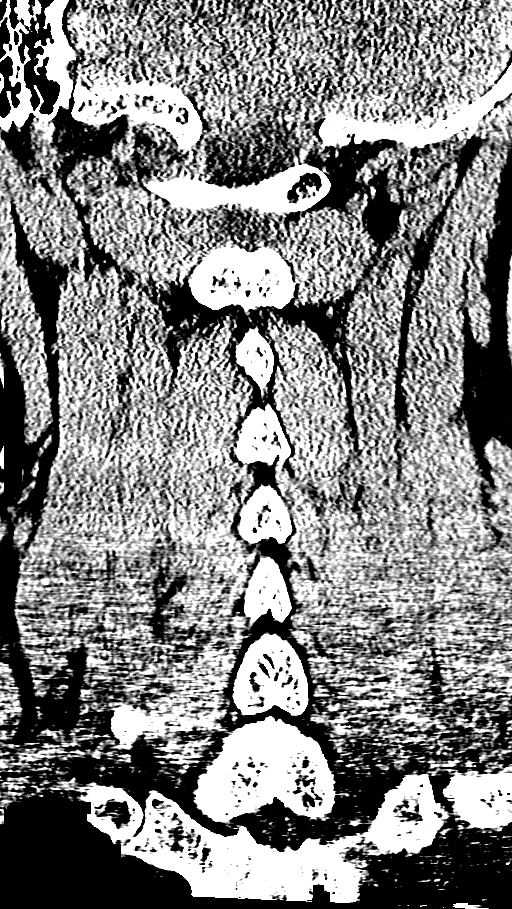

[Series 9: orthogonals · axial · 0.23mm/px · z∈[-410,-239]mm · 10 of 107 slices shown, 13 images]
[im 9/107  brain]
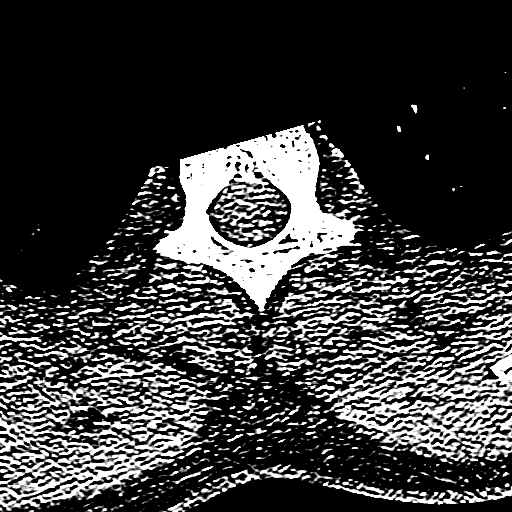
[im 9/107  bone]
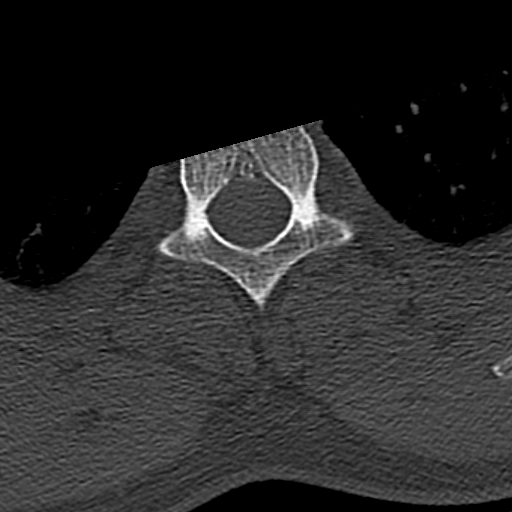
[im 18/107  brain]
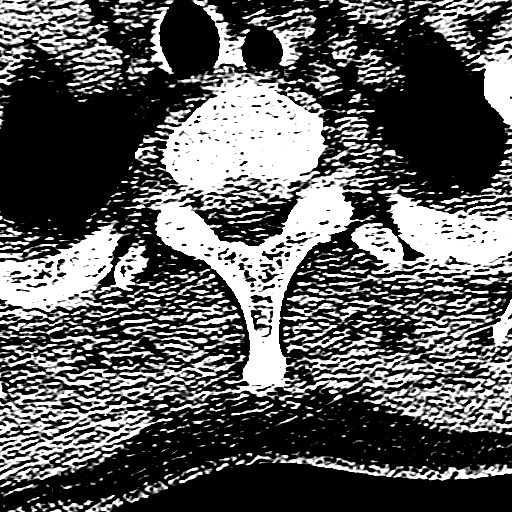
[im 27/107  brain]
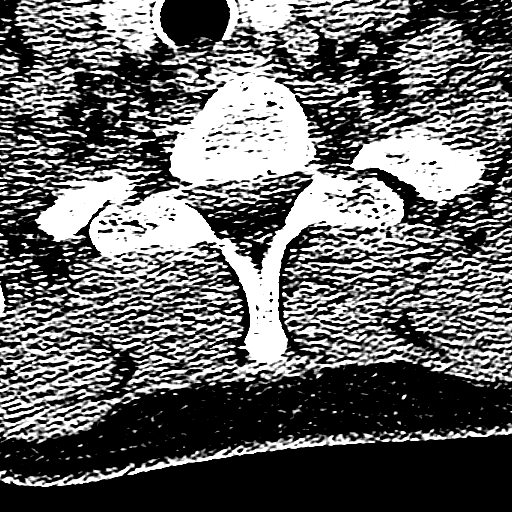
[im 36/107  brain]
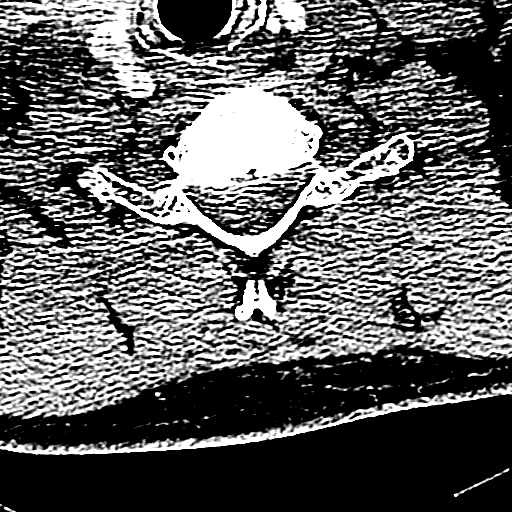
[im 45/107  brain]
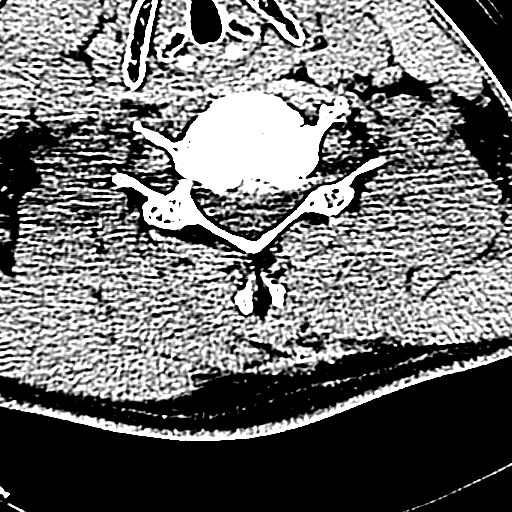
[im 45/107  bone]
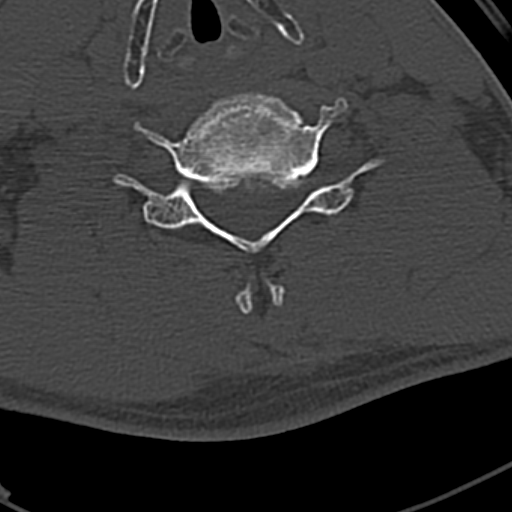
[im 62/107  brain]
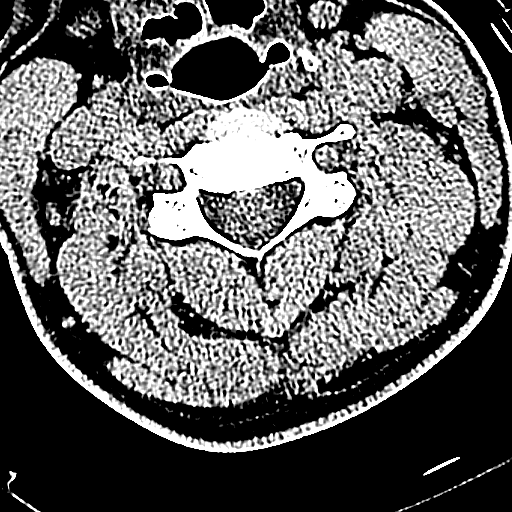
[im 71/107  brain]
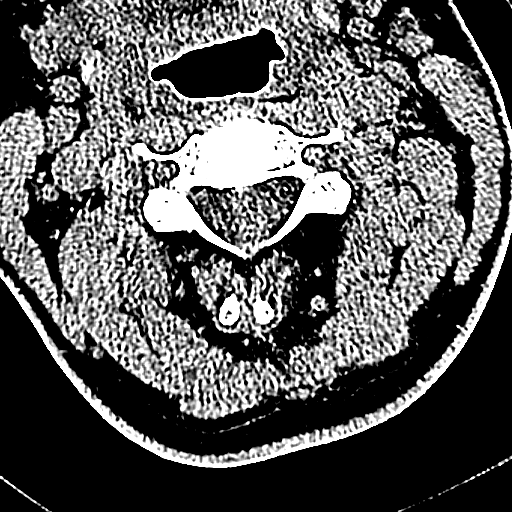
[im 80/107  brain]
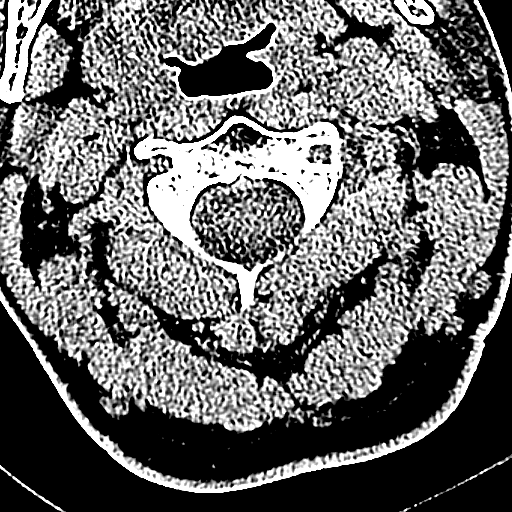
[im 89/107  brain]
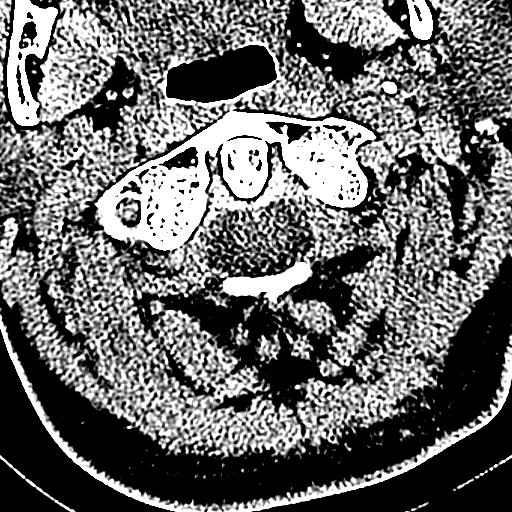
[im 89/107  bone]
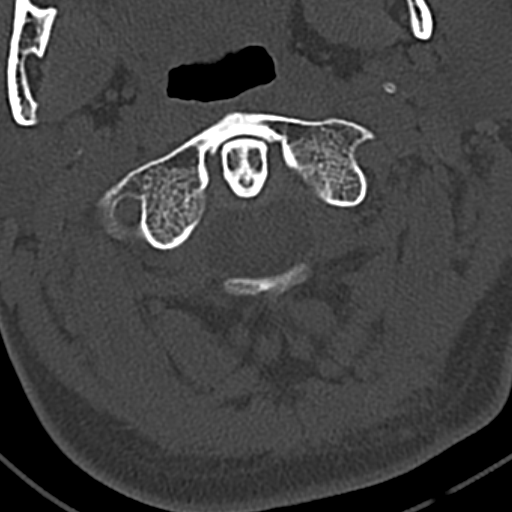
[im 98/107  brain]
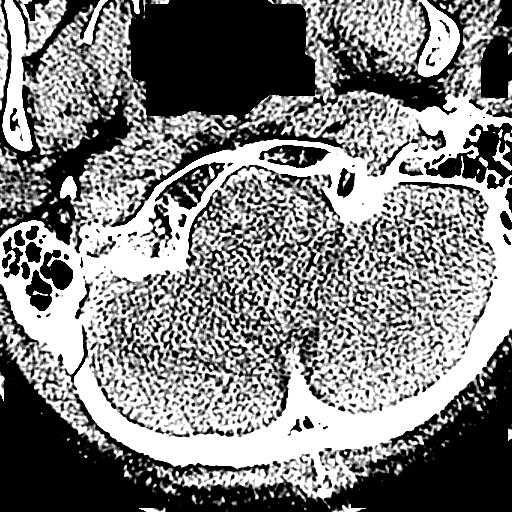

[13 of 47 positions shown; findings below may reference images not displayed]

FINDINGS: CT HEAD FINDINGS

The patient has a nondisplaced skull fracture on the right. The
fracture is seen inferiorly in the occipital bone and extends into
the lambdoid suture which is diastased. The fracture continues
cephalad into the right parietal bone. Overlying soft tissue
swelling is noted. Small amount of hemorrhage is seen extending from
the middle cranial fossa along the left convexities. The CSF space
along the left parietal and temporal convexities is effaced. There
is no pneumocephalus. No evidence of infarct, mass lesion, mass
effect, midline shift or abnormal extra-axial fluid collection is
seen. There is no hydrocephalus.

CT CERVICAL SPINE FINDINGS

No fracture or malalignment of the cervical spine is identified. The
patient has degenerative disc disease most notable at C4-5 and C5-6.
No epidural hematoma is visualized. Lung apices are clear.
IMPRESSION: The study is positive for a nondisplaced skull fracture on the right
extending from the occipital bone into the right parietal bone.

Small extra axial hemorrhage extending from the right middle cranial
fossa along the right temporal and parietal convexities. There is no
mass effect or midline shift.

No acute abnormality cervical spine.

Degenerative disc disease most notable C4-5 and C5-6.

Critical Value/emergent results were called by telephone at the time
of interpretation on 01/21/2014 at [DATE] to Dr. OLAF BECERRIL ,
who verbally acknowledged these results.

## 2016-05-07 IMAGING — CT CT HEAD W/O CM
2 series · 16 of 30 positions shown, 18 images · non-contrast
Comparison: Head CT scan 01/21/2014 and 01/22/2014.

CLINICAL DATA: Status post 8 foot fall from a ladder is 01/21/2014
with a skull fracture. Worsening neurologic symptoms.

EXAM:
CT HEAD WITHOUT CONTRAST
TECHNIQUE: Contiguous axial images were obtained from the base of the skull
through the vertex without intravenous contrast.

[Series 201: head w/o, idose (1) · axial · non-contrast · 0.49mm/px · z∈[+97,+217]mm · 8 of 32 slices shown, 10 images]
[im 4/32  brain]
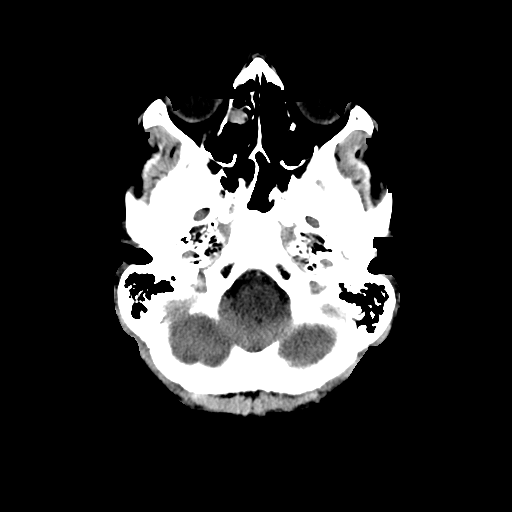
[im 4/32  bone]
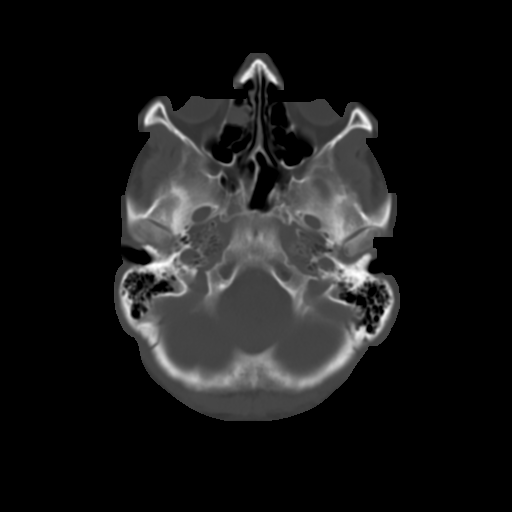
[im 7/32  brain]
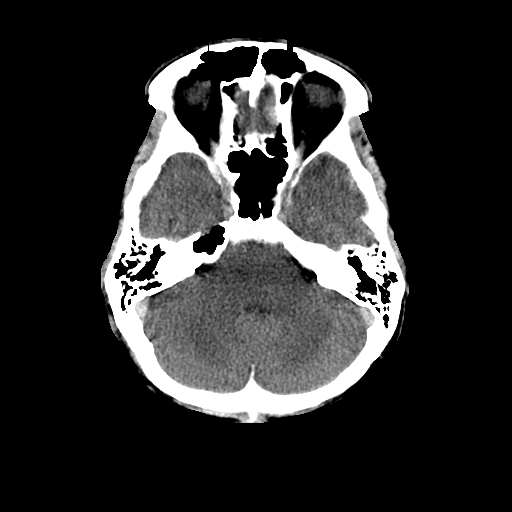
[im 11/32  brain]
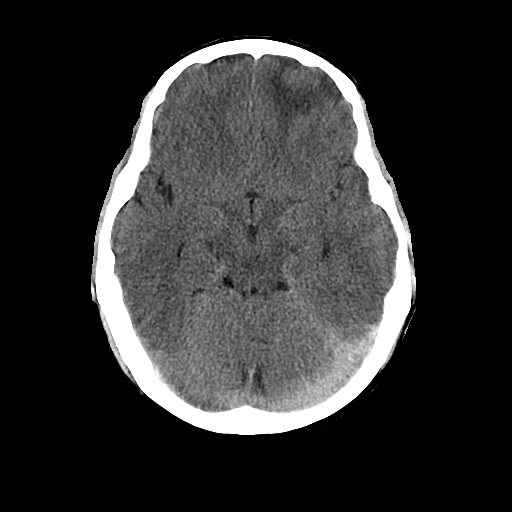
[im 14/32  brain]
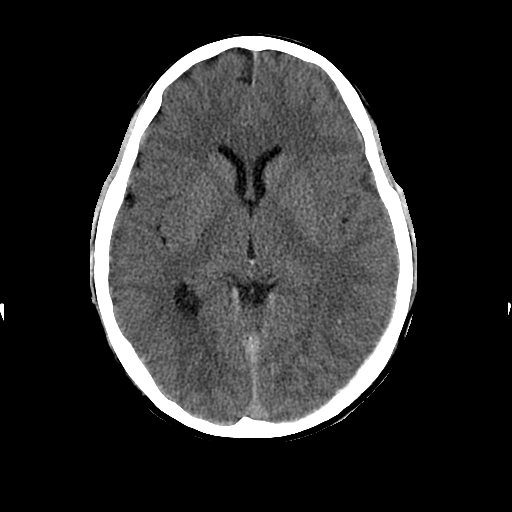
[im 18/32  brain]
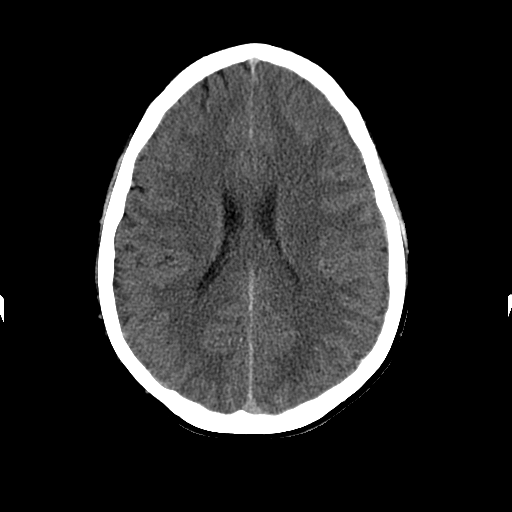
[im 18/32  bone]
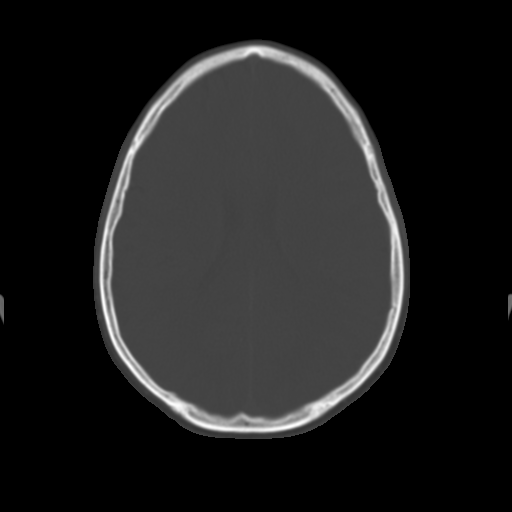
[im 21/32  brain]
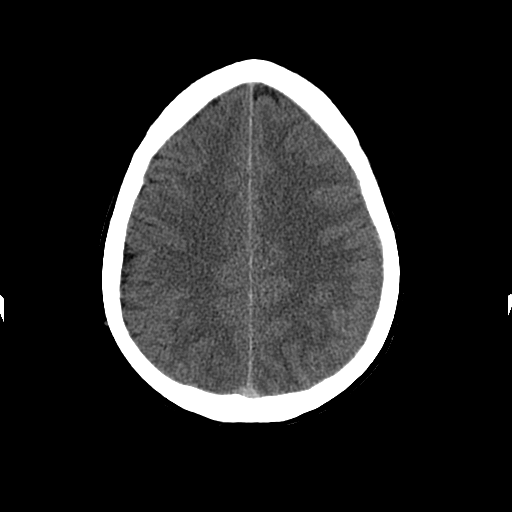
[im 25/32  brain]
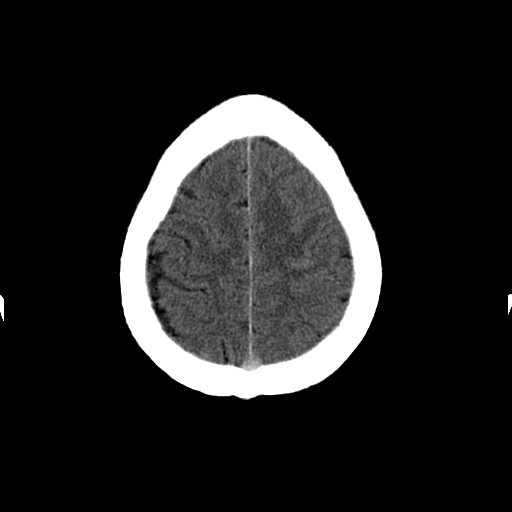
[im 28/32  brain]
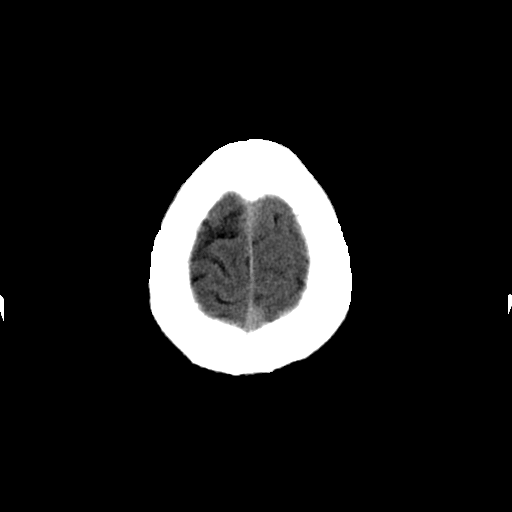

[Series 202: head w/o bone, idose (1) · axial · non-contrast · 0.49mm/px · z∈[+96,+221]mm · 8 of 64 slices shown]
[im 7/64  bone]
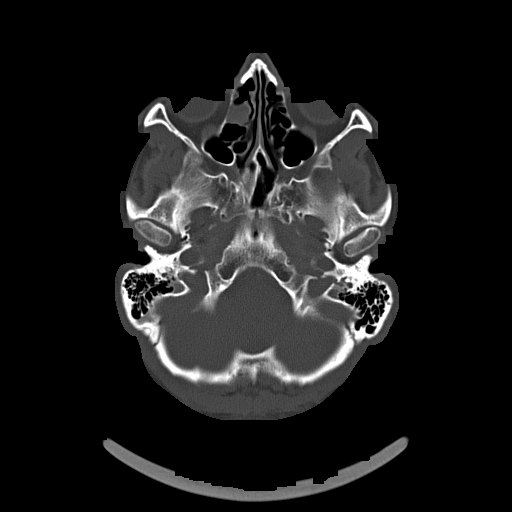
[im 14/64  bone]
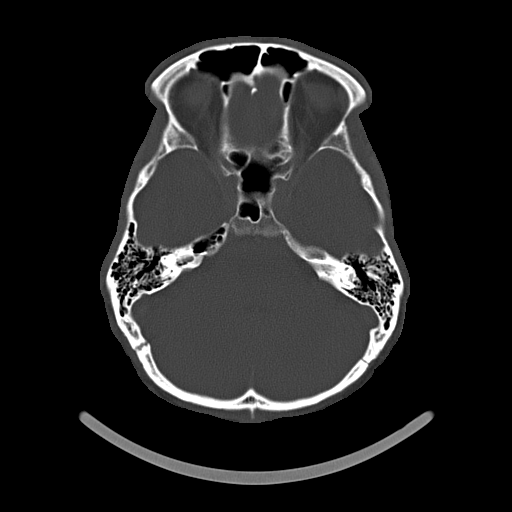
[im 20/64  bone]
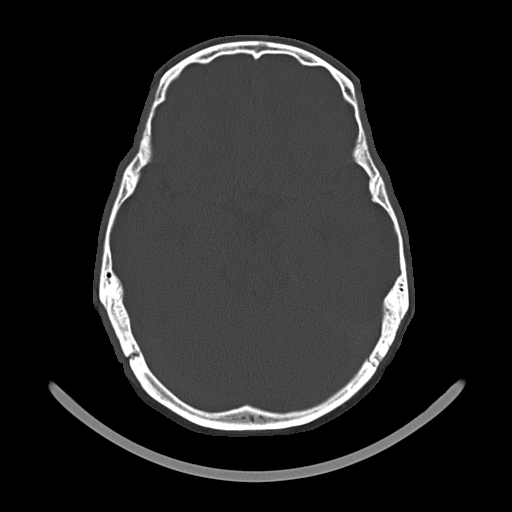
[im 27/64  bone]
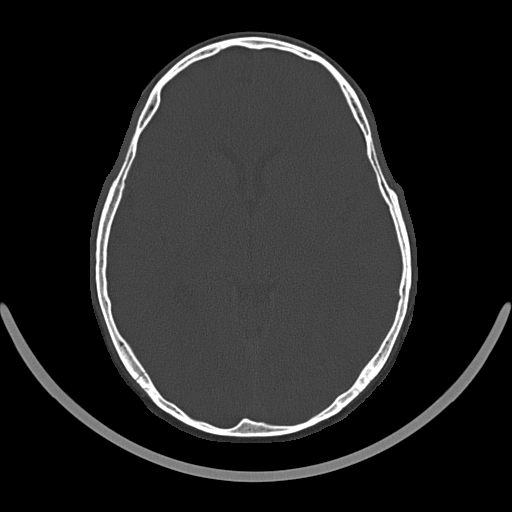
[im 37/64  bone]
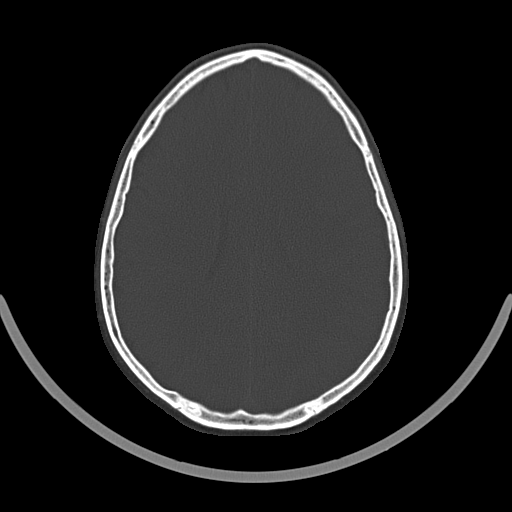
[im 44/64  bone]
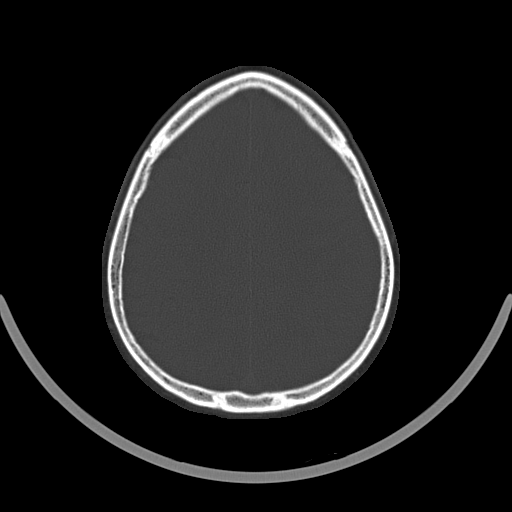
[im 50/64  bone]
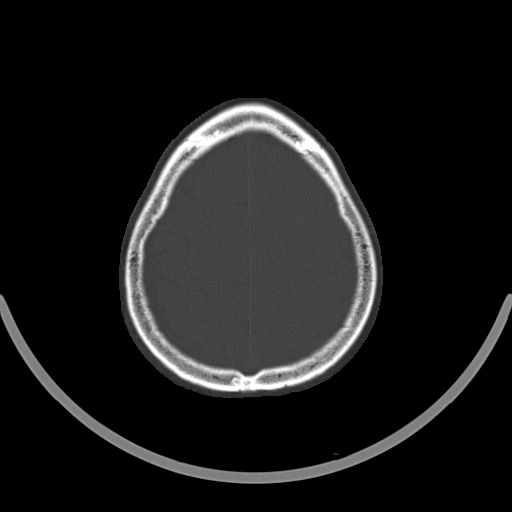
[im 57/64  bone]
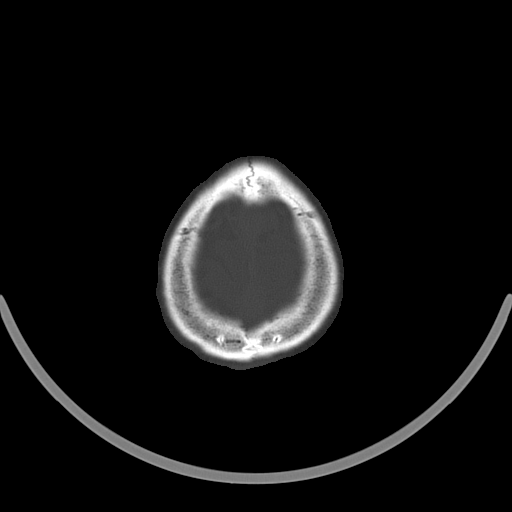

[16 of 30 positions shown; findings below may reference images not displayed]

FINDINGS: There has been expected evolutionary change in hemorrhagic
contusions in the left frontal lobe which demonstrates decreased
attenuation. Very small subdural hemorrhage over the left
frontoparietal convexities also demonstrates evolutionary change
with decreased attenuation seen. The collection continues to measure
approximately 0.4 cm. Small foci of subarachnoid hemorrhage on the
left are not visible today. There is no midline shift, hydrocephalus
or intraventricular hemorrhage. No mass or evidence of acute
infarction is identified. Nondisplaced skull fractures on the right
are again seen.
IMPRESSION: No new abnormality since the prior examinations. Expected
evolutionary change of hemorrhagic contusions in the left frontal
lobe, small amount of subarachnoid hemorrhage on the left and small
left subdural hematoma is seen without associated midline shift.

## 2017-11-12 DIAGNOSIS — F429 Obsessive-compulsive disorder, unspecified: Secondary | ICD-10-CM | POA: Insufficient documentation

## 2017-11-26 ENCOUNTER — Ambulatory Visit: Payer: BLUE CROSS/BLUE SHIELD | Admitting: Psychiatry

## 2017-11-26 DIAGNOSIS — F429 Obsessive-compulsive disorder, unspecified: Secondary | ICD-10-CM | POA: Diagnosis not present

## 2017-11-26 MED ORDER — SERTRALINE HCL 100 MG PO TABS: 100.0000 mg | ORAL_TABLET | Freq: Every day | ORAL | 5 refills | Status: DC

## 2017-11-26 NOTE — Progress Notes (Signed)
[facility]  Patient ID: Mark Hanson,  MRN: [id number]  PCP: Chauncy Lean, PA-C  Date of Evaluation: 11/26/2017 Time spent:20 minutes  Chief Complaint:   HISTORY/CURRENT STATUS: HPI patient was last seen 08/06/2017.  He was doing well.  He carries a diagnosis of OCD, gout and anxiety.  No change in his plan.  Currently the patient is doing well.  Individual Medical History/ Review of Systems: Changes? :No   Allergies: Patient has no known allergies.  Current Medications:  Current Outpatient Medications:  Marland Kitchen  Glucosamine HCl (GLUCOSAMINE PO), Take 2 tablets by mouth daily., Disp: , Rfl:  .  Omega-3 Fatty Acids (FISH OIL PO), Take 2 capsules by mouth daily., Disp: , Rfl:  .  sertraline (ZOLOFT) 100 MG tablet, Take 1 tablet (100 mg total) by mouth daily., Disp: 30 tablet, Rfl: 5 .  HYDROcodone-acetaminophen (NORCO/VICODIN) 5-325 MG per tablet, Take 1-2 tablets by mouth every 4 (four) hours as needed for moderate pain. (Patient not taking: Reported on 11/26/2017), Disp: 60 tablet, Rfl: 0 .  ondansetron (ZOFRAN) 4 MG tablet, Take 1 tablet (4 mg total) by mouth every 6 (six) hours. (Patient not taking: Reported on 11/26/2017), Disp: 20 tablet, Rfl: 0 .  promethazine (PHENERGAN) 12.5 MG tablet, Take 1 tablet (12.5 mg total) by mouth every 6 (six) hours as needed for nausea or vomiting. (Patient not taking: Reported on 11/26/2017), Disp: 40 tablet, Rfl: 0 .  promethazine (PHENERGAN) 12.5 MG tablet, Take 1 tablet (12.5 mg total) by mouth every 6 (six) hours as needed for nausea or vomiting. (Patient not taking: Reported on 01/27/2014), Disp: 30 tablet, Rfl: 0 Medication Side Effects: none  Family Medical/ Social History: Changes? No  MENTAL HEALTH EXAM:  There were no vitals taken for this visit.There is no height or weight on file to calculate BMI.  General Appearance: Casual  Eye Contact:  Good  Speech:  Clear and Coherent  Volume:  Normal  Mood:  Euthymic   Affect:  Appropriate  Thought Process:  Goal Directed  Orientation:  Full (Time, Place, and Person)  Thought Content: Logical   Suicidal Thoughts:  No  Homicidal Thoughts:  No  Memory:  WNL  Judgement:  Good  Insight:  Good  Psychomotor Activity:  Normal  Concentration:  Concentration: Good  Recall:  Good  Fund of Knowledge: Good  Language: Good  Assets:  Social Support  ADL's:  Intact  Cognition: WNL  Prognosis:  Good    DIAGNOSES:    ICD-10-CM   1. Obsessive-compulsive disorder, unspecified type F42.9     Receiving Psychotherapy: No    RECOMMENDATIONS: Patient continues to do well.  He is only on Zoloft 100 mg daily.  I will see him again in 6 months   Anne Fu, PA-C

## 2017-12-06 ENCOUNTER — Other Ambulatory Visit: Payer: Self-pay

## 2017-12-06 MED ORDER — SERTRALINE HCL 100 MG PO TABS: 100.0000 mg | ORAL_TABLET | Freq: Every day | ORAL | 1 refills | Status: DC

## 2018-05-28 ENCOUNTER — Ambulatory Visit: Payer: BLUE CROSS/BLUE SHIELD | Admitting: Psychiatry

## 2018-06-12 ENCOUNTER — Other Ambulatory Visit: Payer: Self-pay

## 2018-06-12 MED ORDER — SERTRALINE HCL 100 MG PO TABS: 100.0000 mg | ORAL_TABLET | Freq: Every day | ORAL | 0 refills | Status: DC

## 2018-07-02 ENCOUNTER — Encounter: Payer: Self-pay | Admitting: Psychiatry

## 2018-07-02 ENCOUNTER — Ambulatory Visit (INDEPENDENT_AMBULATORY_CARE_PROVIDER_SITE_OTHER): Payer: BC Managed Care – PPO | Admitting: Psychiatry

## 2018-07-02 ENCOUNTER — Other Ambulatory Visit: Payer: Self-pay

## 2018-07-02 DIAGNOSIS — F429 Obsessive-compulsive disorder, unspecified: Secondary | ICD-10-CM

## 2018-07-02 NOTE — Progress Notes (Signed)
Mark Hanson [id number] [birth year] 48 y.o.  Subjective:   Patient ID:  Mark Hanson is a 48 y.o. (DOB [birth year]) male.  Chief Complaint:  Chief Complaint  Patient presents with  . Follow-up    Medication Management  . Other    Medication Management    HPI Mark Hanson presents to the office today for follow-up of OCD.  Last seen in November on Zoloft 100 mg daily.  He was doing well and no meds were changed.  Good overall.  OCD really good. Under control for a couple of years.  [employer].  Best he's done in years.  Wonders about stopping meds.  Checking repetively. OC back to college.  Currently minimal time with OCD.  Anxiety is minimal.    Patient reports stable mood and denies depressed or irritable moods.  Patient denies any recent difficulty with anxiety.  Patient denies difficulty with sleep initiation or maintenance. Denies appetite disturbance.  Patient reports that energy and motivation have been good.  Patient denies any difficulty with concentration.  Patient denies any suicidal ideation.  [facility] in [place].  Past Psychiatric Medication Trials: Sertraline 100, clomipramine 100, paroxetine, Pristiq hyper, Luvox 1 month  Review of Systems:  Review of Systems  Neurological: Negative for tremors and weakness.    Medications: I have reviewed the patient's current medications.  Current Outpatient Medications  Medication Sig Dispense Refill  . Glucosamine HCl (GLUCOSAMINE PO) Take 2 tablets by mouth daily.    . Omega-3 Fatty Acids (FISH OIL PO) Take 2 capsules by mouth daily.    . sertraline (ZOLOFT) 100 MG tablet Take 1 tablet (100 mg total) by mouth daily. 90 tablet 0   No current facility-administered medications for this visit.     Medication Side Effects: None  Allergies: No Known Allergies  Past Medical History:  Diagnosis Date  . ADD (attention deficit disorder)     History reviewed. No pertinent family history.  Social  History   Socioeconomic History  . Marital status: Married    Spouse name: Not on file  . Number of children: Not on file  . Years of education: Not on file  . Highest education level: Not on file  Occupational History  . Not on file  Social Needs  . Financial resource strain: Not on file  . Food insecurity    Worry: Not on file    Inability: Not on file  . Transportation needs    Medical: Not on file    Non-medical: Not on file  Tobacco Use  . Smoking status: Never Smoker  . Smokeless tobacco: Never Used  Substance and Sexual Activity  . Alcohol use: No  . Drug use: No  . Sexual activity: Not on file  Lifestyle  . Physical activity    Days per week: Not on file    Minutes per session: Not on file  . Stress: Not on file  Relationships  . Social Herbalist on phone: Not on file    Gets together: Not on file    Attends religious service: Not on file    Active member of club or organization: Not on file    Attends meetings of clubs or organizations: Not on file    Relationship status: Not on file  . Intimate partner violence    Fear of current or ex partner: Not on file    Emotionally abused: Not on file    Physically abused: Not on file  Forced sexual activity: Not on file  Other Topics Concern  . Not on file  Social History Narrative  . Not on file    Past Medical History, Surgical history, Social history, and Family history were reviewed and updated as appropriate.   No family with OCD.    Please see review of systems for further details on the patient's review from today.   Objective:   Physical Exam:  There were no vitals taken for this visit.  Physical Exam Constitutional:      General: He is not in acute distress.    Appearance: He is well-developed.  Musculoskeletal:        General: No deformity.  Neurological:     Mental Status: He is alert and oriented to person, place, and time.     Coordination: Coordination normal.   Psychiatric:        Attention and Perception: Attention and perception normal.        Mood and Affect: Mood is not anxious or depressed. Affect is not labile, blunt, angry or inappropriate.        Speech: Speech normal.        Behavior: Behavior normal.        Thought Content: Thought content normal. Thought content does not include homicidal or suicidal ideation. Thought content does not include homicidal or suicidal plan.        Cognition and Memory: Cognition normal.        Judgment: Judgment normal.     Comments: Insight intact. No auditory or visual hallucinations. No delusions.      Lab Review:     Component Value Date/Time   NA 136 01/27/2014 0938   K 4.0 01/27/2014 0938   CL 97 01/27/2014 0938   CO2 30 01/27/2014 0938   GLUCOSE 120 (H) 01/27/2014 0938   BUN 20 01/27/2014 0938   CREATININE 0.88 01/27/2014 0938   CALCIUM 9.3 01/27/2014 0938   PROT 6.7 01/21/2014 1939   ALBUMIN 3.8 01/21/2014 1939   AST 28 01/21/2014 1939   ALT 20 01/21/2014 1939   ALKPHOS 57 01/21/2014 1939   BILITOT 0.9 01/21/2014 1939   GFRNONAA >90 01/27/2014 0938   GFRAA >90 01/27/2014 0938       Component Value Date/Time   WBC 11.8 (H) 01/27/2014 0938   RBC 5.43 01/27/2014 0938   HGB 17.6 (H) 01/27/2014 0938   HCT 50.1 01/27/2014 0938   PLT 268 01/27/2014 0938   MCV 92.3 01/27/2014 0938   MCH 32.4 01/27/2014 0938   MCHC 35.1 01/27/2014 0938   RDW 12.7 01/27/2014 0938   LYMPHSABS 3.1 01/21/2014 1939   MONOABS 0.7 01/21/2014 1939   EOSABS 0.2 01/21/2014 1939   BASOSABS 0.0 01/21/2014 1939    No results found for: POCLITH, LITHIUM   No results found for: PHENYTOIN, PHENOBARB, VALPROATE, CBMZ   .res Assessment: Plan:     Mark Hanson was seen today for follow-up and other.  Diagnoses and all orders for this visit:  Obsessive-compulsive disorder, unspecified type   Cont current meds.  Gust at length the possibility of tapering medications no way should be done over a period of  approximately 3 months.  Discussed the opportunity to continue the medication.  Discussed the typical relapse rate with OCD off of medication.  He chose to continue the medication as is for now.  Follow-up 6 to 9 months discussed side effects of the medication  Meredith Staggersarey Cottle MD, DFAPA  Please see After Visit Summary for patient  specific instructions.  No future appointments.  No orders of the defined types were placed in this encounter.   -------------------------------

## 2018-09-03 ENCOUNTER — Other Ambulatory Visit: Payer: Self-pay | Admitting: Psychiatry

## 2018-11-28 ENCOUNTER — Other Ambulatory Visit: Payer: Self-pay | Admitting: Psychiatry

## 2019-02-27 ENCOUNTER — Other Ambulatory Visit: Payer: Self-pay | Admitting: Psychiatry

## 2019-05-29 ENCOUNTER — Other Ambulatory Visit: Payer: Self-pay | Admitting: Psychiatry

## 2019-05-29 NOTE — Telephone Encounter (Signed)
Call to RS

## 2019-05-29 NOTE — Telephone Encounter (Signed)
Last apt 06/2018 over due for apt

## 2019-06-24 ENCOUNTER — Other Ambulatory Visit: Payer: Self-pay

## 2019-06-24 MED ORDER — SERTRALINE HCL 100 MG PO TABS: 100.0000 mg | ORAL_TABLET | Freq: Every day | ORAL | 0 refills | Status: DC

## 2019-06-24 NOTE — Telephone Encounter (Signed)
Pt spouse [name] requested Sertraline for [employer]. Fill at the [facility] [address] [place]. Appt scheduled for the first available 8/4.

## 2019-08-20 ENCOUNTER — Ambulatory Visit (INDEPENDENT_AMBULATORY_CARE_PROVIDER_SITE_OTHER): Payer: BC Managed Care – PPO | Admitting: Psychiatry

## 2019-08-20 ENCOUNTER — Encounter: Payer: Self-pay | Admitting: Psychiatry

## 2019-08-20 ENCOUNTER — Other Ambulatory Visit: Payer: Self-pay

## 2019-08-20 DIAGNOSIS — F429 Obsessive-compulsive disorder, unspecified: Secondary | ICD-10-CM | POA: Diagnosis not present

## 2019-08-20 MED ORDER — SERTRALINE HCL 100 MG PO TABS: 100.0000 mg | ORAL_TABLET | Freq: Every day | ORAL | 3 refills | Status: DC

## 2019-08-20 NOTE — Progress Notes (Signed)
Emad Brechtel [id number] 02-May-1970 49 y.o.  Subjective:   Patient ID:  Mark Hanson is a 49 y.o. (DOB 30-Oct-1970) male.  Chief Complaint:  Chief Complaint  Patient presents with  . Follow-up    OCD    HPI Braysen Cloward presents to the office today for follow-up of OCD.  Last seen in June 2020 on Zoloft 100 mg daily.  He was doing well and no meds were changed.  08/20/2019 appt with the following noted: Continued sertraline 100 mg daily and gives all credit to God.   Good overall.  OCD really good. Under control for a couple of years.  [employer].  Best he's done in years.  Wonders about stopping meds.  Checking repetively. OC back to college.  Currently minimal time with OCD.  Anxiety is minimal.   Wife made appt for him.  Disc weaning with wife.  Wife thinks it's still helps.    Patient reports stable mood and denies depressed or irritable moods.  Patient denies any recent difficulty with anxiety.  Patient denies difficulty with sleep initiation or maintenance. Denies appetite disturbance.  Patient reports that energy and motivation have been good.  Patient denies any difficulty with concentration.  Patient denies any suicidal ideation.  [facility] in [place].  Past Psychiatric Medication Trials: Sertraline 100, clomipramine 100, paroxetine, Pristiq hyper, Luvox 1 month Hx [employer].  Review of Systems:  Review of Systems  Musculoskeletal: Positive for arthralgias.  Neurological: Negative for tremors and weakness.    Medications: I have reviewed the patient's current medications.  Current Outpatient Medications  Medication Sig Dispense Refill  . Glucosamine HCl (GLUCOSAMINE PO) Take 2 tablets by mouth daily.    . Omega-3 Fatty Acids (FISH OIL PO) Take 2 capsules by mouth daily.    . sertraline (ZOLOFT) 100 MG tablet Take 1 tablet (100 mg total) by mouth daily. 90 tablet 3   No current facility-administered medications for this visit.     Medication Side Effects: None  Allergies: No Known Allergies  Past Medical History:  Diagnosis Date  . ADD (attention deficit disorder)     History reviewed. No pertinent family history.  Social History   Socioeconomic History  . Marital status: Married    Spouse name: Not on file  . Number of children: Not on file  . Years of education: Not on file  . Highest education level: Not on file  Occupational History  . Not on file  Tobacco Use  . Smoking status: Never Smoker  . Smokeless tobacco: Never Used  Substance and Sexual Activity  . Alcohol use: No  . Drug use: No  . Sexual activity: Not on file  Other Topics Concern  . Not on file  Social History Narrative  . Not on file   Social Determinants of Health   Financial Resource Strain:   . Difficulty of Paying Living Expenses:   Food Insecurity:   . Worried About Programme researcher, broadcasting/film/video in the Last Year:   . Barista in the Last Year:   Transportation Needs:   . Freight forwarder (Medical):   Marland Kitchen Lack of Transportation (Non-Medical):   Physical Activity:   . Days of Exercise per Week:   . Minutes of Exercise per Session:   Stress:   . Feeling of Stress :   Social Connections:   . Frequency of Communication with Friends and Family:   . Frequency of Social Gatherings with Friends and Family:   .  Attends Religious Services:   . Active Member of Clubs or Organizations:   . Attends Banker Meetings:   Marland Kitchen Marital Status:   Intimate Partner Violence:   . Fear of Current or Ex-Partner:   . Emotionally Abused:   Marland Kitchen Physically Abused:   . Sexually Abused:     Past Medical History, Surgical history, Social history, and Family history were reviewed and updated as appropriate.   No family with OCD.    Please see review of systems for further details on the patient's review from today.   Objective:   Physical Exam:  There were no vitals taken for this visit.  Physical  Exam Constitutional:      General: He is not in acute distress.    Appearance: He is well-developed.  Musculoskeletal:        General: No deformity.  Neurological:     Mental Status: He is alert and oriented to person, place, and time.     Coordination: Coordination normal.  Psychiatric:        Attention and Perception: Attention and perception normal.        Mood and Affect: Mood is not anxious or depressed. Affect is not labile, blunt, angry or inappropriate.        Speech: Speech normal.        Behavior: Behavior normal.        Thought Content: Thought content normal. Thought content does not include homicidal or suicidal ideation. Thought content does not include homicidal or suicidal plan.        Cognition and Memory: Cognition normal.        Judgment: Judgment normal.     Comments: Insight intact. No auditory or visual hallucinations. No delusions.  OCD under control     Lab Review:     Component Value Date/Time   NA 136 01/27/2014 0938   K 4.0 01/27/2014 0938   CL 97 01/27/2014 0938   CO2 30 01/27/2014 0938   GLUCOSE 120 (H) 01/27/2014 0938   BUN 20 01/27/2014 0938   CREATININE 0.88 01/27/2014 0938   CALCIUM 9.3 01/27/2014 0938   PROT 6.7 01/21/2014 1939   ALBUMIN 3.8 01/21/2014 1939   AST 28 01/21/2014 1939   ALT 20 01/21/2014 1939   ALKPHOS 57 01/21/2014 1939   BILITOT 0.9 01/21/2014 1939   GFRNONAA >90 01/27/2014 0938   GFRAA >90 01/27/2014 0938       Component Value Date/Time   WBC 11.8 (H) 01/27/2014 0938   RBC 5.43 01/27/2014 0938   HGB 17.6 (H) 01/27/2014 0938   HCT 50.1 01/27/2014 0938   PLT 268 01/27/2014 0938   MCV 92.3 01/27/2014 0938   MCH 32.4 01/27/2014 0938   MCHC 35.1 01/27/2014 0938   RDW 12.7 01/27/2014 0938   LYMPHSABS 3.1 01/21/2014 1939   MONOABS 0.7 01/21/2014 1939   EOSABS 0.2 01/21/2014 1939   BASOSABS 0.0 01/21/2014 1939    No results found for: POCLITH, LITHIUM   No results found for: PHENYTOIN, PHENOBARB, VALPROATE, CBMZ    .res Assessment: Plan:     Vane was seen today for follow-up.  Diagnoses and all orders for this visit:  Obsessive-compulsive disorder, unspecified type -     sertraline (ZOLOFT) 100 MG tablet; Take 1 tablet (100 mg total) by mouth daily.   Cont current meds. Discussed at length the possibility of tapering medications no way should be done over a period of approximately 3 months.  Discussed the opportunity to continue  the medication.  Discussed the typical relapse rate with OCD off of medication.  He chose to continue the medication as is for now.    Follow-up 12 mos  Meredith Staggers MD, DFAPA  Please see After Visit Summary for patient specific instructions.  No future appointments.  No orders of the defined types were placed in this encounter.   -------------------------------

## 2020-01-07 ENCOUNTER — Other Ambulatory Visit: Payer: Self-pay | Admitting: Psychiatry

## 2020-01-07 DIAGNOSIS — F429 Obsessive-compulsive disorder, unspecified: Secondary | ICD-10-CM

## 2020-11-02 ENCOUNTER — Other Ambulatory Visit: Payer: Self-pay | Admitting: Psychiatry

## 2020-11-02 DIAGNOSIS — F429 Obsessive-compulsive disorder, unspecified: Secondary | ICD-10-CM

## 2020-11-02 NOTE — Telephone Encounter (Signed)
Please schedule an appt.  Last appt 8/21. F/U should have been yearly.

## 2020-11-05 NOTE — Telephone Encounter (Signed)
/  Let message for pt to schedule appt

## 2021-03-07 ENCOUNTER — Ambulatory Visit: Payer: BC Managed Care – PPO | Admitting: Behavioral Health

## 2021-03-21 ENCOUNTER — Encounter: Payer: Self-pay | Admitting: Behavioral Health

## 2021-03-21 ENCOUNTER — Ambulatory Visit: Payer: BC Managed Care – PPO | Admitting: Behavioral Health

## 2021-03-21 ENCOUNTER — Other Ambulatory Visit: Payer: Self-pay

## 2021-03-21 VITALS — BP 149/94 | HR 77 | Ht 70.0 in | Wt 188.0 lb

## 2021-03-21 DIAGNOSIS — F429 Obsessive-compulsive disorder, unspecified: Secondary | ICD-10-CM

## 2021-03-21 DIAGNOSIS — F331 Major depressive disorder, recurrent, moderate: Secondary | ICD-10-CM | POA: Diagnosis not present

## 2021-03-21 DIAGNOSIS — F411 Generalized anxiety disorder: Secondary | ICD-10-CM

## 2021-03-21 MED ORDER — SERTRALINE HCL 50 MG PO TABS: ORAL_TABLET | ORAL | 1 refills | Status: DC

## 2021-03-21 NOTE — Progress Notes (Signed)
[facility] ? ?Patient ID: [name],  ?MRN: [id number] ? ?PCP: Chauncy Lean, PA-C ? ?Date of Evaluation: 03/21/2021 ?Time spent:45 minutes ? ?Chief Complaint:  ?Chief Complaint   ?Depression; Anxiety; Follow-up; Medication Refill; Medication Problem ?  ? ? ?HISTORY/CURRENT STATUS: ?HPI ?51 year old male presents to this office for follow up visit and to establish care. He is former patient of Dr. Meredith Staggers and [facility] this practice.. Says that he wished he never stopped his Zoloft 1.5 years ago. Says Dr. Jennelle Human warned him about going off meds to soon. Says that he was trusting God to heal him and he knows that God understands his struggles. Says that he is back in today because he is tired of feeling this way and is ready to start his medication again. Says that he needs a breakthrough. Say he realizes it will take time for him to feel better. He would like to reinitiate Zoloft. Says his anxiety is 6/10 and depression is 4/10. He is sleeping 7-8 hours per night. He denies any mania, no psychosis. No SI/HI.  ? ?Previous psychiatric medications: ?Zoloft ?Clomipramine 100 ?Paroxetine,  ?Pristiq- hyper ?Luvox-one month ? ? ? ? ?Individual Medical History/ Review of Systems: Changes? :No  ? ?Allergies: Patient has no known allergies. ? ?Current Medications:  ?Current Outpatient Medications:  ?  sertraline (ZOLOFT) 50 MG tablet, Take 1/2 tablet 25 mg for 7 days, then one whole tablet 50 mg total daily., Disp: 30 tablet, Rfl: 1 ?  Glucosamine HCl (GLUCOSAMINE PO), Take 2 tablets by mouth daily. (Patient not taking: Reported on 03/21/2021), Disp: , Rfl:  ?  Omega-3 Fatty Acids (FISH OIL PO), Take 2 capsules by mouth daily. (Patient not taking: Reported on 03/21/2021), Disp: , Rfl:  ?  sertraline (ZOLOFT) 100 MG tablet, TAKE 1 TABLET BY MOUTH EVERY DAY (Patient not taking: Reported on 03/21/2021), Disp: 90 tablet, Rfl: 2 ?Medication Side Effects: none ? ?Family Medical/ Social History: Changes?  No ? ?MENTAL HEALTH EXAM: ? ?Blood pressure (!) 149/94, pulse 77, height 5\' 10"  (1.778 m), weight 188 lb (85.3 kg).Body mass index is 26.98 kg/m?.  ?General Appearance: Casual, Neat, and Well Groomed  ?Eye Contact:  Fair  ?Speech:  Clear and Coherent and Talkative  ?Volume:  Normal  ?Mood:  Anxious and Depressed  ?Affect:  Congruent, Depressed, and Anxious  ?Thought Process:  Coherent  ?Orientation:  Full (Time, Place, and Person)  ?Thought Content: Logical   ?Suicidal Thoughts:  No  ?Homicidal Thoughts:  No  ?Memory:  WNL  ?Judgement:  Good  ?Insight:  Good  ?Psychomotor Activity:  Normal  ?Concentration:  Concentration: Fair  ?Recall:  Fair  ?Fund of Knowledge: Fair  ?Language: Fair  ?Assets:  Desire for Improvement ?Physical Health ?Resilience ?Social Support  ?ADL's:  Intact  ?Cognition: WNL  ?Prognosis:  Good  ? ? ?DIAGNOSES:  ?  ICD-10-CM   ?1. Obsessive-compulsive disorder, unspecified type  F42.9   ?  ?2. Generalized anxiety disorder  F41.1   ?  ?3. Major depressive disorder, recurrent episode, moderate (HCC)  F33.1   ?  ? ? ?Receiving Psychotherapy: No  ? ? ?RECOMMENDATIONS:  ? ?Greater than 50% of face to face time with patient was spent on counseling and coordination of care. We discussed his recent decline with anxiety and depression. We discussed his past treatment with other clinicians in this practice and treatment plan. We discussed his decision to stop taking medication 1.5 years ago and then his gradual decline.  ?  We agreed to: ?Reinitiate Zoloft 25 mg for 7 days, then 50 mg daily ?To report any worsening symptoms or side effects promptly ?To follow up in 4 weeks to reassess  ?Provided emergency contact information ?Reviewed PDMP  ? ? ?Joan Flores, NP  ?

## 2021-03-28 ENCOUNTER — Other Ambulatory Visit: Payer: Self-pay

## 2021-03-28 ENCOUNTER — Encounter: Payer: Self-pay | Admitting: Behavioral Health

## 2021-03-28 ENCOUNTER — Ambulatory Visit: Payer: BC Managed Care – PPO | Admitting: Behavioral Health

## 2021-03-28 DIAGNOSIS — F411 Generalized anxiety disorder: Secondary | ICD-10-CM

## 2021-03-28 DIAGNOSIS — F332 Major depressive disorder, recurrent severe without psychotic features: Secondary | ICD-10-CM

## 2021-03-28 DIAGNOSIS — F429 Obsessive-compulsive disorder, unspecified: Secondary | ICD-10-CM | POA: Diagnosis not present

## 2021-03-28 MED ORDER — SERTRALINE HCL 100 MG PO TABS: 100.0000 mg | ORAL_TABLET | Freq: Every day | ORAL | 1 refills | Status: DC

## 2021-03-28 MED ORDER — CARIPRAZINE HCL 1.5 MG PO CAPS: 1.5000 mg | ORAL_CAPSULE | Freq: Every day | ORAL | 0 refills | Status: DC

## 2021-03-28 NOTE — Progress Notes (Signed)
[facility] ? ?Patient ID: Mark Hanson,  ?MRN: [id number] ? ?PCP: Iva Lento, PA-C ? ?Date of Evaluation: 03/28/2021 ?Time spent:30 minutes ? ?Chief Complaint:  ?Chief Complaint   ?Anxiety; Depression; OCD; Suicidal Ideation; Follow-up; Medication Problem; Medication Refill ?  ? ? ?HISTORY/CURRENT STATUS: ?HPI ? ?51 year old male presents to this office for follow up visit and to establish care. He is former patient of Dr. Lynder Parents and [facility] this practice. Says he called in for an emergency appointment. Says that he had a horrible weekend. Says that he was "at church in the house of God and then that woman". Says that he was sitting next to his wife but noticed a strange woman in church that he lusted after. Says that he began to ruminate over the thought over and over. Says the guilt is so bad that he "pleaded the blood for deliverance". Says he cannot believe he continues to fail like this. He says that he has had suicidal ideation all weekend, but without plan. Says that he know that he could not follow through because of his [place] and love for his family. He endorses racing thoughts, trouble concentrating, irritability, and restlessness. Says that he needs a breakthrough from God. Says he realizes it will take time for him to feel better and the medications to work. He wanted to write down exact instructions. Says his anxiety is 10/10 and depression is 10/10. He is sleeping 7-8 hours per night. He denies any mania, no psychosis. No SI/HI. He verbally contracted for safety today and understands his resources if needed.  ?  ?Previous psychiatric medications: ?Zoloft ?Clomipramine 100 ?Paroxetine,  ?Pristiq- hyper ?Luvox-one month ? ?Individual Medical History/ Review of Systems: Changes? :No  ? ?Allergies: Patient has no known allergies. ? ?Current Medications:  ?Current Outpatient Medications:  ?  cariprazine (VRAYLAR) 1.5 MG capsule, Take 1 capsule (1.5 mg total) by  mouth daily., Disp: 30 capsule, Rfl: 0 ?  Glucosamine HCl (GLUCOSAMINE PO), Take 2 tablets by mouth daily. (Patient not taking: Reported on 03/21/2021), Disp: , Rfl:  ?  Omega-3 Fatty Acids (FISH OIL PO), Take 2 capsules by mouth daily. (Patient not taking: Reported on 03/21/2021), Disp: , Rfl:  ?  sertraline (ZOLOFT) 100 MG tablet, Take 1 tablet (100 mg total) by mouth daily., Disp: 30 tablet, Rfl: 1 ?Medication Side Effects: none ? ?Family Medical/ Social History: Changes? No ? ?MENTAL HEALTH EXAM: ? ?There were no vitals taken for this visit.There is no height or weight on file to calculate BMI.  ?General Appearance: Casual and Neat  ?Eye Contact:  Good  ?Speech:  Clear and Coherent and Talkative  ?Volume:  Increased  ?Mood:  Anxious, Depressed, and Dysphoric  ?Affect:  Congruent, Labile, and Anxious  ?Thought Process:  Coherent and Descriptions of Associations: Tangential  ?Orientation:  Full (Time, Place, and Person)  ?Thought Content: Logical   ?Suicidal Thoughts:  Yes.  without intent/plan  ?Homicidal Thoughts:  No  ?Memory:  WNL  ?Judgement:  Fair  ?Insight:  Fair  ?Psychomotor Activity:  Increased  ?Concentration:  Concentration: Fair  ?Recall:  Fair  ?Fund of Knowledge: Good  ?Language: Good  ?Assets:  Desire for Improvement  ?ADL's:  Intact  ?Cognition: WNL  ?Prognosis:  Good  ? ? ?DIAGNOSES:  ?  ICD-10-CM   ?1. Severe episode of recurrent major depressive disorder, without psychotic features ([place])  F33.2   ?  ?2. Obsessive-compulsive disorder, unspecified type  F42.9 cariprazine (VRAYLAR) 1.5 MG  capsule  ?  sertraline (ZOLOFT) 100 MG tablet  ?  ?3. Generalized anxiety disorder  F41.1 cariprazine (VRAYLAR) 1.5 MG capsule  ?  ? ? ?Receiving Psychotherapy: No  ? ? ?RECOMMENDATIONS:  ? ?Greater than 50% of  30 min face to face time with patient was spent on counseling and coordination of care. We discussed his recent decline and reporting severe anxiety and depression. He is ruminating on his perceived spiritual  failures and becoming very disturbed.He was worked in early today. His original scheduled appt was in April.  Discussed his suicidal ideation without a plan. He was very upset because he was in church and lusted after another woman he did not know while sitting next to his wife. He appeared severely distressed and felt like he was in very dark place. He understands that the Zoloft will take 4-6 weeks to work but said he could not wait because of his decline.  ? We discussed his decision to stop taking medication 1.5 years ago and then his gradual decline.  ?We agreed to: ?He will increase Zoloft to 50 mg tomorrow and then in one week will increase to 100 mg daily. ?He will start Vraylar 1.5 mg taken after 6 pm or supper due to his work schedule.  ?To report any worsening symptoms or side effects promptly ?To follow up in 4 weeks to reassess  ?Provided emergency contact information including suicide hotline. He understands he can call 911 or go to the nearest emergency dept. ?Discussed potential metabolic side effects associated with atypical antipsychotics, as well as potential risk for movement side effects. Advised pt to contact office if movement side effects occur.   ? ?Reviewed PDMP  ?  ? ? ? ? ?Elwanda [place], NP  ?

## 2021-04-18 ENCOUNTER — Encounter: Payer: Self-pay | Admitting: Behavioral Health

## 2021-04-18 ENCOUNTER — Ambulatory Visit: Payer: BC Managed Care – PPO | Admitting: Behavioral Health

## 2021-04-18 DIAGNOSIS — F429 Obsessive-compulsive disorder, unspecified: Secondary | ICD-10-CM | POA: Diagnosis not present

## 2021-04-18 DIAGNOSIS — F331 Major depressive disorder, recurrent, moderate: Secondary | ICD-10-CM

## 2021-04-18 DIAGNOSIS — F411 Generalized anxiety disorder: Secondary | ICD-10-CM

## 2021-04-18 DIAGNOSIS — F332 Major depressive disorder, recurrent severe without psychotic features: Secondary | ICD-10-CM | POA: Diagnosis not present

## 2021-04-18 MED ORDER — CARIPRAZINE HCL 1.5 MG PO CAPS: 1.5000 mg | ORAL_CAPSULE | Freq: Every day | ORAL | 0 refills | Status: DC

## 2021-04-18 NOTE — Progress Notes (Signed)
[facility] ? ?Patient ID: Mark Hanson,  ?MRN: [id number] ? ?PCP: Iva Lento, PA-C ? ?Date of Evaluation: 04/18/2021 ?Time spent:30 minutes ? ?Chief Complaint:  ?Chief Complaint   ?Anxiety; Depression; Follow-up; Medication Refill ?  ? ? ?HISTORY/CURRENT STATUS: ?HPI ? ?51 year old male presents to this office for follow up visit and to establish care. He is former patient of Dr. Lynder Parents and [facility] this practice. He is smiling and says that he feels great today. Says he is not 100% with his anxiety but doing much better. He also understands that it is early and that he has not been taking medication long enough for full benefit. He agree to f/u in a couple more weeks.  Says he realizes it will take time for him to feel better and the medications to work. He wanted to write down exact instructions. Says his anxiety is 3/10 and depression is 0/10. He is sleeping 7-8 hours per night. He denies any mania, no psychosis. No SI/HI. He verbally contracted for safety today and understands his resources if needed.  ?  ?Previous psychiatric medications: ?Zoloft ?Clomipramine 100 ?Paroxetine,  ?Pristiq- hyper ?Luvox-one month ? ? ?Individual Medical History/ Review of Systems: Changes? :No  ? ?Allergies: Patient has no known allergies. ? ?Current Medications:  ?Current Outpatient Medications:  ?  cariprazine (VRAYLAR) 1.5 MG capsule, Take 1 capsule (1.5 mg total) by mouth daily., Disp: 30 capsule, Rfl: 0 ?  Glucosamine HCl (GLUCOSAMINE PO), Take 2 tablets by mouth daily. (Patient not taking: Reported on 03/21/2021), Disp: , Rfl:  ?  Omega-3 Fatty Acids (FISH OIL PO), Take 2 capsules by mouth daily. (Patient not taking: Reported on 03/21/2021), Disp: , Rfl:  ?  sertraline (ZOLOFT) 100 MG tablet, Take 1 tablet (100 mg total) by mouth daily., Disp: 30 tablet, Rfl: 1 ?Medication Side Effects: none ? ?Family Medical/ Social History: Changes? No ? ?MENTAL HEALTH EXAM: ? ?There were no vitals taken for  this visit.There is no height or weight on file to calculate BMI.  ?General Appearance: Casual, Neat, and Well Groomed  ?Eye Contact:  Good  ?Speech:  Clear and Coherent  ?Volume:  Normal  ?Mood:  NA  ?Affect:  Appropriate  ?Thought Process:  Coherent  ?Orientation:  Full (Time, Place, and Person)  ?Thought Content: Logical   ?Suicidal Thoughts:  No  ?Homicidal Thoughts:  No  ?Memory:  WNL  ?Judgement:  Good  ?Insight:  NA  ?Psychomotor Activity:  Normal  ?Concentration:  Concentration: Good  ?Recall:  Good  ?Fund of Knowledge: Good  ?Language: Good  ?Assets:  Desire for Improvement ?Resilience ?Social Support  ?ADL's:  Intact  ?Cognition: WNL  ?Prognosis:  Good  ? ? ?DIAGNOSES:  ?  ICD-10-CM   ?1. Obsessive-compulsive disorder, unspecified type  F42.9   ?  ?2. Generalized anxiety disorder  F41.1   ?  ?3. Severe episode of recurrent major depressive disorder, without psychotic features ([place])  F33.2   ?  ?4. Major depressive disorder, recurrent episode, moderate (HCC)  F33.1   ?  ? ? ?Receiving Psychotherapy: No  ? ? ?RECOMMENDATIONS:  ? ?Greater than 50% of  30 min face to face time with patient was spent on counseling and coordination of care. We discussed his recent improvement with anxiety and depression.  He feels like medication is starting to work. He understands that the Zoloft will take a full 4-6 weeks to work but said he could not wait because of his decline. He  feels like the Arman Filter is working great.  ? We discussed his decision to stop taking medication 1.5 years ago and then his gradual decline.  ?We agreed to: ?Continue Zoloft 100 mg daily ?He will continue Vraylar 1.5 mg taken after 6 pm or supper due to his work schedule.  ?To report any worsening symptoms or side effects promptly ?To follow up in 4 weeks to reassess  ?Provided emergency contact information including suicide hotline. He understands he can call 911 or go to the nearest emergency dept. ?Discussed potential metabolic side effects  associated with atypical antipsychotics, as well as potential risk for movement side effects. Advised pt to contact office if movement side effects occur.   ?  ?Reviewed PDMP  ? ? ? ? ? ? ? ? ? ?Elwanda Brooklyn, NP  ?

## 2021-05-02 ENCOUNTER — Telehealth: Payer: Self-pay | Admitting: Behavioral Health

## 2021-05-02 NOTE — Telephone Encounter (Signed)
Pt called back and said that the the medicine is working however you told him that if he wanted to increase his nightime med to 100 mg to let you know. He wants to do that. Please call him at [phone] ?

## 2021-05-02 NOTE — Telephone Encounter (Signed)
Patient called twice today wanting to talk to you. He kept saying you told him he could bump his medication up to 100 mg. It is Dietitian that he wants to increase. He said you told him he could increase it. He feels he has gotten some benefit from the 1.5 dose and he wants to get better faster. He listed his diagnoses as OCD, mild depression, social anxiety, and regular anxiety.  ?

## 2021-05-02 NOTE — Telephone Encounter (Signed)
Pt reporting in as advised from apt 2 weeks ago. Contact # [phone] ?

## 2021-05-03 ENCOUNTER — Other Ambulatory Visit: Payer: Self-pay | Admitting: Behavioral Health

## 2021-05-03 DIAGNOSIS — F429 Obsessive-compulsive disorder, unspecified: Secondary | ICD-10-CM

## 2021-05-03 DIAGNOSIS — F332 Major depressive disorder, recurrent severe without psychotic features: Secondary | ICD-10-CM

## 2021-05-03 DIAGNOSIS — F39 Unspecified mood [affective] disorder: Secondary | ICD-10-CM

## 2021-05-03 DIAGNOSIS — F411 Generalized anxiety disorder: Secondary | ICD-10-CM

## 2021-05-03 MED ORDER — CARIPRAZINE HCL 3 MG PO CAPS: 3.0000 mg | ORAL_CAPSULE | Freq: Every day | ORAL | 1 refills | Status: DC

## 2021-05-03 NOTE — Telephone Encounter (Signed)
Patient notified

## 2021-05-03 NOTE — Telephone Encounter (Signed)
Suspecting underlying mood disorder but wanted to wait. Please advise him to not waste what he has but to take two of the 1.5 mg capsules (3 mg total) daily. I sent a  new script for 3 mg capsules to [facility].

## 2021-05-17 ENCOUNTER — Encounter (HOSPITAL_COMMUNITY): Payer: Self-pay | Admitting: Registered Nurse

## 2021-05-17 ENCOUNTER — Encounter: Payer: Self-pay | Admitting: Behavioral Health

## 2021-05-17 ENCOUNTER — Ambulatory Visit: Payer: BC Managed Care – PPO | Admitting: Behavioral Health

## 2021-05-17 ENCOUNTER — Ambulatory Visit (HOSPITAL_COMMUNITY)
Admission: EM | Admit: 2021-05-17 | Discharge: 2021-05-18 | Disposition: A | Discharge status: Other Acute Inpatient Hospital | Payer: BC Managed Care – PPO | Attending: Registered Nurse | Admitting: Registered Nurse

## 2021-05-17 DIAGNOSIS — F429 Obsessive-compulsive disorder, unspecified: Secondary | ICD-10-CM

## 2021-05-17 DIAGNOSIS — F39 Unspecified mood [affective] disorder: Secondary | ICD-10-CM | POA: Diagnosis not present

## 2021-05-17 DIAGNOSIS — F332 Major depressive disorder, recurrent severe without psychotic features: Secondary | ICD-10-CM | POA: Diagnosis present

## 2021-05-17 DIAGNOSIS — F430 Acute stress reaction: Secondary | ICD-10-CM | POA: Insufficient documentation

## 2021-05-17 DIAGNOSIS — F410 Panic disorder [episodic paroxysmal anxiety] without agoraphobia: Secondary | ICD-10-CM

## 2021-05-17 DIAGNOSIS — Z20822 Contact with and (suspected) exposure to covid-19: Secondary | ICD-10-CM | POA: Diagnosis not present

## 2021-05-17 DIAGNOSIS — F411 Generalized anxiety disorder: Secondary | ICD-10-CM | POA: Diagnosis not present

## 2021-05-17 DIAGNOSIS — R45851 Suicidal ideations: Secondary | ICD-10-CM | POA: Diagnosis not present

## 2021-05-17 DIAGNOSIS — Z79899 Other long term (current) drug therapy: Secondary | ICD-10-CM | POA: Insufficient documentation

## 2021-05-17 LAB — POCT URINE DRUG SCREEN - MANUAL ENTRY (I-SCREEN)
POC Amphetamine UR: NOT DETECTED
POC Buprenorphine (BUP): NOT DETECTED
POC Cocaine UR: NOT DETECTED
POC Marijuana UR: NOT DETECTED
POC Methadone UR: NOT DETECTED
POC Methamphetamine UR: NOT DETECTED
POC Morphine: NOT DETECTED
POC Oxazepam (BZO): NOT DETECTED
POC Oxycodone UR: NOT DETECTED
POC Secobarbital (BAR): NOT DETECTED

## 2021-05-17 LAB — URINALYSIS, ROUTINE W REFLEX MICROSCOPIC
Bilirubin Urine: NEGATIVE
Glucose, UA: NEGATIVE mg/dL
Hgb urine dipstick: NEGATIVE
Ketones, ur: NEGATIVE mg/dL
Leukocytes,Ua: NEGATIVE
Nitrite: NEGATIVE
Protein, ur: NEGATIVE mg/dL
Specific Gravity, Urine: 1.023 (ref 1.005–1.030)
pH: 6 (ref 5.0–8.0)

## 2021-05-17 LAB — POC SARS CORONAVIRUS 2 AG: SARSCOV2ONAVIRUS 2 AG: NEGATIVE

## 2021-05-17 LAB — RESP PANEL BY RT-PCR (FLU A&B, COVID) ARPGX2
Influenza A by PCR: NEGATIVE
Influenza B by PCR: NEGATIVE
SARS Coronavirus 2 by RT PCR: NEGATIVE

## 2021-05-17 MED ORDER — ALUM & MAG HYDROXIDE-SIMETH 200-200-20 MG/5ML PO SUSP: 30.0000 mL | ORAL | Status: DC | PRN

## 2021-05-17 MED ORDER — ACETAMINOPHEN 325 MG PO TABS: 650.0000 mg | ORAL_TABLET | Freq: Four times a day (QID) | ORAL | Status: DC | PRN

## 2021-05-17 MED ORDER — SERTRALINE HCL 50 MG PO TABS
150.0000 mg | ORAL_TABLET | Freq: Every morning | ORAL | Status: DC
  Filled 2021-05-17: qty 1

## 2021-05-17 MED ORDER — CARIPRAZINE HCL 1.5 MG PO CAPS
1.5000 mg | ORAL_CAPSULE | Freq: Every day | ORAL | Status: DC
  Administered 2021-05-17: 1.5 mg via ORAL
  Filled 2021-05-17: qty 1

## 2021-05-17 MED ORDER — MAGNESIUM HYDROXIDE 400 MG/5ML PO SUSP: 30.0000 mL | Freq: Every day | ORAL | Status: DC | PRN

## 2021-05-17 NOTE — ED Notes (Signed)
Patient cooperative with all admission assessments and screenings. 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 Pt oriented to the unit, attempted to draw labs but unable to obtain, patient given water and food, encouraged to drink to regain some hydration to be able to draw blood. POC Covid test negative, UDS negative for all drugs.  ?

## 2021-05-17 NOTE — BH Assessment (Signed)
Comprehensive Clinical Assessment (CCA) Note ? ?05/17/2021 ?Mark Hanson ?[id number] ?DISPOSITION: [place] NP recommends an inpatient admission to assist with stabilization.  ? ?[facility] ED from 05/17/2021 in [facility]  ?C-SSRS RISK CATEGORY High Risk  ? ?  ? The patient demonstrates the following risk factors for suicide: Chronic risk factors for suicide include: psychiatric disorder of depression . Acute risk factors for suicide include:  depression . Protective factors for this patient include: coping skills. Considering these factors, the overall suicide risk at this point appears to be high. Patient is not appropriate for outpatient follow up.  ? ?Patient is a 51 year old male that presents this date with ongoing anxiety associated with his current employment. Patient is employed by the city as a Chemical engineer and reports S/I with a plan to "let go off the back of his garbage truck." Patient denies any H/I or AVH. Patient is currently receiving OP services from [facility] where he sees Mark Chris NP that assists with medication management. Patient has concerns over recent medication changes (Patient cannot recall medications but is speaking with wife out front to assist also see MAR) once he is roomed. Patient has been diagnosed with Depression and GAD although cannot this date identify any specific stressors stating "it's everything." Patient is also an Education administrator and reports he feels guilty at times due to not having "stronger faith." Patient states he recently started back on medications in the last 2 months after being off them for over a year. Patient denies any SA use.   ? ?Below is note from [facility] NP who met with patient this date and writes:  ?Mark Hanson", 51 year old male presents to this office today for follow up and medication management. Unlike last visit with significant progress, he presents today withdrawn and distressed. He says, "I don't know why I keep  going through this". He brought written list of things that were wrong today. He says "Things are so bad, I am afraid, I am fearful of myself". He acknowledges that he was doing fantastic last visit and he felt Arman Filter was working well. He was smiling and happy but not 100%. Since we increased the Vraylar to 3 mg, he feels he is not sure what happened and has been declining emotionally.  He feels more anxiety, fearful, and paralyzed. Says that he woke his wife up to pray for him and kept her awake for two hours. This morning he felt if he was having a panic  attack. His chest felt heavy and was full of fear. Says he feels strong since of doom. Says he once again is feeling suicidal like there is no hope. 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 When asked if he feels safe and that he will not harm self, he hesitates and says "I feel like I cannot guarantee that". Feels like if he goes back to work he may harm himself. I asked him if he felt like he needed to be hospitalized or evaluated further and he says yes. He wanted to contact wife and notify her of his intentions and did not feel safe to drive. She was notified and is picking him up to drive to [facility]. He has concerns that he cannot be alone with any women. That this spiritually set him back because he cannot prevent lusting for them. Says  he continues to fail in this area, although he prays about it constantly and ask God for strength. He distraught because he sometimes cannot control this in church and feels convicted.He did not want to be observed by male staff in the waiting room while waiting for his wife for transport. He endorses racing thoughts, panic, lack of concentration, irritability and rapid mood swings. Reports anxiety 10/10 and depression 10/10.  He has been sleeping about 6 hours per night.  He denies mania, no psychosis auditory or visual  hallucinations, no delirium. He endorses SI with plan. No HI. ? ?Previous psychiatric medications: ?Zoloft ?Clomipramine 100 ?Paroxetine,  ?Pristiq- hyper activity ?Luvox-one month and stopped ? ?Patient is alert and oriented x 4. Patient speaks in a normal tone although is observed to be anxious. Patient's memory is intact with thoughts organized. Patient's mood is depressed with affect congruent. Patient does not appear to be responding to internal stimuli.  ?  ? ?Chief Complaint: No chief complaint on file. ? ?Visit Diagnosis: MDD recurrent without psychotic features, severe, GAD  ? ? ?CCA Screening, Triage and Referral ([facility]) ? ?Patient Reported Information ?How did you hear about [place]? Self ? ?What Is the Reason for Your Visit/Call Today? Pt reports ongoing thoughts to self harm ? ?How Long Has This Been Causing You Problems? 1 wk - 1 month ? ?What Do You Feel Would Help You the Most Today? Treatment for Depression or other mood problem ? ? ?Have You Recently Had Any Thoughts About Hurting Yourself? Yes ? ?Are You Planning to Commit Suicide/Harm Yourself At This time? No ? ? ?Have you Recently Had Thoughts About [place]? No ? ?Are You Planning to Harm Someone at This Time? No ? ?Explanation: No data recorded ? ?Have You Used Any Alcohol or Drugs in the Past 24 Hours? No ? ?How Long Ago Did You Use Drugs or Alcohol? No data recorded ?What Did You Use and How Much? No data recorded ? ?Do You Currently Have a Therapist/Psychiatrist? Yes ? ?Name of Therapist/Psychiatrist: [facility] ? ? ?Have You Been Recently Discharged From Any Office Practice or Programs? No ? ?Explanation of Discharge From Practice/Program: No data recorded ? ?  ?CCA Screening Triage Referral Assessment ?Type of Contact: Face-to-Face ? ?Telemedicine Service Delivery:   ?Is this Initial or Reassessment? No data recorded ?Date Telepsych consult ordered in CHL:  No data recorded ?Time Telepsych consult ordered in CHL:  No  data recorded ?Location of Assessment: [facility] Assessment Services ? ?Provider Location: [facility] Assessment Services ? ? ?Collateral Involvement: None at this time ? ? ?Does Patient Have a Stage manager Guardian? No data recorded ?Name and Contact of Legal Guardian: No data recorded ?If Minor and Not Living with Parent(s), Who has Custody? No data recorded ?Is CPS involved or ever been involved? Never ? ?Is APS involved or ever been involved? Never ? ? ?Patient Determined To Be At Risk for Harm To Self or Others Based on Review of Patient Reported Information or Presenting Complaint? Yes, for Self-Harm ? ?Method: No data recorded ?Availability of Means: No data recorded ?Intent: No data recorded ?Notification Required: No data recorded ?Additional Information for Danger to Others Potential: No data recorded ?Additional Comments for Danger to Others Potential: No data recorded ?Are There Guns or Other Weapons in [place]? No data recorded ?Types of Guns/Weapons: No data recorded ?Are These Weapons Safely Secured?  No data recorded ?Who Could Verify You Are Able To Have These Secured: No data recorded ?Do You Have any Outstanding Charges, Pending Court Dates, Parole/Probation? No data recorded ?Contacted To Inform of Risk of Harm To Self or Others: Other: Comment (NA) ? ? ? ?Does Patient Present under Involuntary Commitment? No ? ?IVC Papers Initial File Date: No data recorded ? ?County of Residence: [place] ? ? ?Patient Currently Receiving the Following Services: Medication Management ? ? ?Determination of Need: Emergent (2 hours) ? ? ?Options For Referral: Inpatient Hospitalization ? ? ? ? ?CCA Biopsychosocial ?Patient Reported Schizophrenia/Schizoaffective Diagnosis in Past: No ? ? ?Strengths: Pt is willing to participate in treatment ? ? ?Mental Health Symptoms ?Depression:   ?Change in energy/activity; Difficulty Concentrating; Hopelessness ?  ?Duration of Depressive symptoms:   ?Duration of Depressive Symptoms: Less than two weeks ?  ?Mania:   ?None ?  ?Anxiety:    ?Difficulty concentrating; Restlessness; Worrying; Tension ?  ?Psychosis:   ?None ?  ?Duration of Psychotic symptoms:

## 2021-05-17 NOTE — BH Assessment (Signed)
Patient is a 51 year old male that presents this date with ongoing anxiety associated with his current employment. Patient is currently receiving OP services from [facility] where he sees [name] NP that assists with medication management. Patient has concerns over recent medication changes (Patient cannot recall medications but is speaking with wife out front to assist) once he is roomed. 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 Patient is also an Gaffer and reports he feels guilty at times due to not having "stronger faith." Patient has been diagnosed with MDD and GAD stating he has only started back on medications in the last 2 months after being off them for over a year. Patient denies any SA use.    ?

## 2021-05-17 NOTE — ED Provider Notes (Signed)
BH Urgent Care Continuous Assessment Admission H&P ? ?Date: 05/17/21 ?Patient Name: Mark Hanson ?MRN: [id number] ?Chief Complaint: No chief complaint on file. ?   ? ?Diagnoses:  ?Final diagnoses:  ?MDD (major depressive disorder), recurrent severe, without psychosis (HCC)  ?Unspecified mood (affective) disorder (HCC)  ?Obsessive-compulsive disorder, unspecified type  ?Panic attack as reaction to stress  ?GAD (generalized anxiety disorder)  ? ? ?HPI: Mark Hanson, 51 y.o., [facility] accompanied by his wife with complaints of worsening anxiety, depression, panic attack, and suicidal ideation. ? ?Patient seen face to face by this provider, consulted with Dr. Earlene PlaterKatherine Laubach; and chart reviewed on 05/17/21.  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  He reports worsening anxiety and depression.  Patient is unable to contract for safety.   ?During evaluation Mark Kelchhilip Faries is alert/oriented x 4; calm, and cooperative.  His mood is anxious and depressed with congruent affect.  He does not appear to be responding to internal/external stimuli or delusional thoughts.  Patient denies harm/homicidal ideation, psychosis, and paranoia.  Patient answered question appropriately.    ? ?Per Provider Note at [facility] today 05/17/2021:  "Mark Hanson", 51 year old male presents to this office today for follow up and medication management. Unlike last visit with significant progress, he presents today withdrawn and distressed. He says, "I don't know why I keep going through this". He brought written list of things that were wrong today. He says "Things are so bad, I am afraid, I am fearful of myself". He acknowledges that he was doing fantastic last  visit and he felt Leafy KindleVraylar was working well. He was smiling and happy but not 100%. Since we increased the Vraylar to 3 mg, he feels he is not sure what happened and has been declining emotionally.  He feels more anxiety, fearful, and paralyzed. Says that he woke his wife up to pray for him and kept her awake for two hours. This morning he felt if he was having a panic  attack. His chest felt heavy and was full of fear. Says he feels strong since of doom. Says he once again is feeling suicidal like there is no hope. 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 When asked if he feels safe and that he will not harm self, he hesitates and says "I feel like I cannot guarantee that". Feels like if he goes back to work he may harm himself. I asked him if he felt like he needed to be hospitalized or evaluated further and he says yes. He wanted to contact wife and notify her of his intentions and did not feel safe to drive. She was notified and is [facility]. He has concerns that he cannot be alone with any women. That this spiritually set him back because he cannot prevent lusting for them. Says he continues to fail in this area, although he prays about it constantly and ask God for strength. He distraught because he sometimes cannot control this in church and feels convicted.He did not want to be observed by male staff  in the waiting room while waiting for his wife for transport. He endorses racing thoughts, panic, lack of concentration, irritability and rapid mood swings. Reports anxiety 10/10 and depression 10/10.  He has been sleeping about 6 hours per night.  He denies mania, no psychosis auditory or visual hallucinations, no delirium. He endorses SI with plan. No HI. ? ? ? ? ?PHQ 2-9:  ?Flowsheet Row ED from 05/17/2021 in [facility] ?Most recent reading at 05/17/2021   2:36 PM Office Visit from 05/17/2021 in [facility] ?Most recent reading at 05/17/2021  1:11 PM Office Visit from 03/21/2021 in [facility] ?Most recent reading at 03/21/2021  3:49 PM  ?Thoughts that you would be better off dead, or of hurting yourself in some way Nearly every day Nearly every day More than half the days  ?PHQ-9 Total Score 18 23 10   ? ?  ?  ?Flowsheet Row ED from [facility]/17/2021 in [facility]  ?C-SSRS RISK CATEGORY High Risk  ? ?  ?  ? ?Total Time spent with patient: 45 minutes ? ?Musculoskeletal  ?Strength & Muscle Tone: within normal limits ?Gait & Station: normal ?Patient leans: N/A ? ?Psychiatric Specialty Exam  ?Presentation ?General Appearance: Appropriate for Environment; Casual; Neat ? ?Eye Contact:Good ? ?Speech:Clear and Coherent; Normal Rate ? ?Speech Volume:Normal ? ?Handedness:No data recorded ? ?Mood and Affect  ?Mood:Anxious; Depressed; Hopeless ? ?Affect:Restricted ? ? ?Thought Process  ?Thought Processes:Coherent; Goal Directed ? ?Descriptions of Associations:Intact ? ?Orientation:Full (Time, Place and Person) ? ?Thought Content:Logical ? Diagnosis of Schizophrenia or Schizoaffective disorder in past: No ?  ?Hallucinations:Hallucinations: None ? ?Ideas of Reference:None ? ?Suicidal Thoughts:Suicidal Thoughts: Yes, Active ?SI Active Intent and/or Plan: With Intent; With Plan ? ?Homicidal Thoughts:Homicidal Thoughts: No ? ? ?Sensorium  ?Memory:Immediate Good; Recent Good ? ?Judgment:Intact ? ?Insight:Fair ? ? ?Executive Functions  ?Concentration:Fair ? ?Attention Span:Fair ? ?Recall:Good ? ?Fund of Knowledge:Good ? ?Language:Good ? ? ?Psychomotor Activity  ?Psychomotor Activity:Psychomotor Activity: Restlessness ? ? ?Assets  ?Assets:Communication Skills; Desire for Improvement; Financial Resources/Insurance; Housing; Resilience; Social Support; Transportation ? ? ?Sleep  ?Sleep:Sleep: Fair ? ? ?Nutritional Assessment (For OBS and [facility]  admissions only) ?Has the patient had a weight loss or gain of 10 pounds or more in the last 3 months?: No ?Has the patient had a decrease in food intake/or appetite?: No ?Does the patient have dental problems?: No ?Does the patient have eating habits or behaviors that may be indicators of an eating disorder including binging or inducing vomiting?: No ?Has the patient recently lost weight without trying?: 0 ?Has the patient been eating poorly because of a decreased appetite?: 0 ?Malnutrition Screening Tool Score: 0 ? ? ? ?Physical Exam ?Vitals and nursing note reviewed. Exam conducted with a chaperone present.  ?Constitutional:   ?   General: He is not in acute distress. ?   Appearance: Normal appearance. He is not ill-appearing.  ?Cardiovascular:  ?   Rate and Rhythm: Normal rate.  ?Pulmonary:  ?   Effort: Pulmonary effort is normal.  ?Skin: ?   General: Skin is warm and dry.  ?Neurological:  ?   Mental Status: He is alert and oriented to person, place, and time.  ?Psychiatric:     ?   Attention and Perception: Attention and perception normal. He does not perceive auditory or visual hallucinations.     ?   Mood and Affect: Mood is anxious and depressed.     ?   Speech:  Speech normal.     ?   Behavior: Behavior normal. Behavior is cooperative.     ?   Thought Content: Thought content is not paranoid or delusional. Thought content includes suicidal ideation. Thought content does not include homicidal ideation. Thought content includes suicidal plan.  ? ?Review of Systems  ?Constitutional: Negative.   ?HENT: Negative.    ?Eyes: Negative.   ?Respiratory: Negative.    ?Cardiovascular: Negative.   ?Gastrointestinal: Negative.   ?Genitourinary: Negative.   ?Musculoskeletal: Negative.   ?Skin: Negative.   ?Neurological: Negative.   ?Endo/Heme/Allergies: Negative.   ?Psychiatric/Behavioral:  Positive for depression and suicidal ideas. Nervous/anxious: Denies hearing voices but states racing thoughts to kill himself when he  at work.   ? ?Blood pressure 138/86, pulse 64, temperature 97.9 ?F (36.6 ?C), temperature source Oral, resp. rate 18, SpO2 100 %. There is no height or weight on file to calculate BMI. ? ?Past Psychiatric Hi

## 2021-05-17 NOTE — ED Notes (Signed)
Writer attempted to draw patient's blood x2. Writer was unsuccessful.  ?

## 2021-05-17 NOTE — Progress Notes (Signed)
[facility] ? ?Patient ID: [name],  ?MRN: [id number] ? ?PCP: Chauncy Lean, PA-C ? ?Date of Evaluation: 05/17/2021 ?Time spent:30 minutes ? ?Chief Complaint:  ?Chief Complaint   ?Anxiety; Depression; Follow-up; Medication Problem; Suicidal Ideation; Sexual Problem ?  ? ? ?HISTORY/CURRENT STATUS: ?HPI ? ?"Jance", 51 year old male presents to this office today for follow up and medication management. Unlike last visit with significant progress, he presents today withdrawn and distressed. He says, "I don't know why I keep going through this". He brought written list of things that were wrong today. He says "Things are so bad, I am afraid, I am fearful of myself". He acknowledges that he was doing fantastic last visit and he felt [name] was working well. He was smiling and happy but not 100%. Since we increased the Vraylar to 3 mg, he feels he is not sure what happened and has been declining emotionally.  He feels more anxiety, fearful, and paralyzed. Says that he woke his wife up to pray for him and kept her awake for two hours. This morning he felt if he was having a panic  attack. His chest felt heavy and was full of fear. Says he feels strong since of doom. Says he once again is feeling suicidal like there is no hope. 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 When asked if he feels safe and that he will not harm self, he hesitates and says "I feel like I cannot guarantee that". Feels like if he goes back to work he may harm himself. I asked him if he felt like he needed to be hospitalized or evaluated further and he says yes. He wanted to contact wife and notify her of his intentions and did not feel safe to drive. She was notified and is picking him up to drive to [facility]. He has concerns that he cannot be alone with any women. That this spiritually set him back because he cannot prevent  lusting for them. Says he continues to fail in this area, although he prays about it constantly and ask God for strength. He distraught because he sometimes cannot control this in church and feels convicted.He did not want to be observed by male staff in the waiting room while waiting for his wife for transport. He endorses racing thoughts, panic, lack of concentration, irritability and rapid mood swings. Reports anxiety 10/10 and depression 10/10.  He has been sleeping about 6 hours per night.  He denies mania, no psychosis auditory or visual hallucinations, no delirium. He endorses SI with plan. No HI. ? ?Previous psychiatric medications: ?Zoloft ?Clomipramine 100 ?Paroxetine,  ?Pristiq- hyper activity ?Luvox-one month and stopped ? ? ?Individual Medical History/ Review of Systems: Changes? :No  ? ?Allergies: Patient has no known allergies. ? ?Current Medications:  ?Current Outpatient Medications:  ?  cariprazine (VRAYLAR) 1.5 MG capsule, Take 1 capsule (1.5 mg total) by mouth daily., Disp: 30 capsule, Rfl: 0 ?  cariprazine (VRAYLAR) 3 MG capsule, Take 1 capsule (3 mg total) by mouth daily., Disp: 30 capsule, Rfl: 1 ?  Glucosamine HCl (GLUCOSAMINE PO), Take 2 tablets by mouth daily. (Patient not taking: Reported on 03/21/2021), Disp: , Rfl:  ?  Omega-3 Fatty Acids (FISH OIL PO), Take 2 capsules by mouth daily. (Patient not taking: Reported on 03/21/2021), Disp: , Rfl:  ?  sertraline (ZOLOFT) 100 MG tablet,  Take 1 tablet (100 mg total) by mouth daily., Disp: 30 tablet, Rfl: 1 ?Medication Side Effects: anxiety has increased  ? ?Family Medical/ Social History: Changes? no ? ?MENTAL HEALTH EXAM: ? ?There were no vitals taken for this visit.There is no height or weight on file to calculate BMI.  ?General Appearance: Casual, Neat, and Well Groomed  ?Eye Contact:  Good  ?Speech:  Clear and Coherent and Slow, hesitant  ?Volume:  Decreased  ?Mood:  Anxious, Depressed, Dysphoric, and Hopeless  ?Affect:  Congruent, Depressed,  Flat, Restricted, Tearful, and Anxious  ?Thought Process:  Coherent  ?Orientation:  Full (Time, Place, and Person)  ?Thought Content: Logical   ?Suicidal Thoughts:  Yes.  with intent/plan  ?Homicidal Thoughts:  No  ?Memory:  WNL  ?Judgement:  Fair  ?Insight:  Fair  ?Psychomotor Activity:  Normal  ?Concentration:  Concentration: Fair  ?Recall:  Good  ?Fund of Knowledge: Fair  ?Language: Fair  ?Assets:  Desire for Improvement ?Resilience ?Social Support  ?ADL's:  Intact  ?Cognition: WNL  ?Prognosis:  Fair  ? ? ?DIAGNOSES:  ?  ICD-10-CM   ?1. Severe episode of recurrent major depressive disorder, without psychotic features (HCC)  F33.2   ?  ?2. Obsessive-compulsive disorder, unspecified type  F42.9   ?  ?3. Generalized anxiety disorder  F41.1   ?  ?4. Unspecified mood (affective) disorder (HCC)  F39   ?  ?5. Panic attack  F41.0   ?  ? ? ?Receiving Psychotherapy: No  ? ? ?RECOMMENDATIONS:  ? ?Greater than 50% of  30 min face to face time with patient was spent on counseling and coordination of care. We discussed his recent decline which says started again after increasing Vraylar to 3 mg. He has had a pattern of improving and declining since seeking mental health care. So far his medications tend to work for [employer] and then he declines. Hx of non-compliance with medication in the past. Before seeking care in this office he stopped taking his Zoloft 1.5 years ago and then experienced a return of major depression and anxiety. He often get fixated on his shortcomings and relates them to spiritual failure. When I saw him last visit, he reported feeling fantastic and today no evidence of progress and reports of worsening symptom.  ? ?Today because of his reported suicidality and inability to contract for safety, I felt it was in his best interest to be evaluated by staff at [facility]. His wife will transport him there for safety. ?I recommended he seek therapy to include IOP in a supervised setting.  ?I also recommend that he: ?   ?Should increase the  Zoloft to 150 mg daily ?To reduce his Vraylar 3 mg  to 1.5 mg  as he was before when reported good progress.  ?To report any worsening symptoms or side effects promptly ?To follow up in 4 weeks to reassess  ?Provided emergency contact information including suicide hotline. He understands he can call 911 or go to the nearest emergency dept. ?Discussed potential metabolic side effects associated with atypical antipsychotics, as well as potential risk for movement side effects. Advised pt to contact office if movement side effects occur.   ?  ?Reviewed PDMP  ?  ?  ?  ?  ?  ? ? ? ? ? ?Joan Flores, NP  ?

## 2021-05-17 NOTE — ED Notes (Signed)
Pt was just given a snack  ?

## 2021-05-17 NOTE — Progress Notes (Signed)
Patient states he already took the Zoloft today. ?

## 2021-05-17 NOTE — Progress Notes (Signed)
Patient did not take Zoloft, stating the dosage was wrong. RN will inform Provider. ?

## 2021-05-17 NOTE — ED Notes (Signed)
Pt A&O x 4, resting at present, no distress noted.  Monitoring for safety.  Pending [facility] at 10pm. ?

## 2021-05-17 NOTE — ED Notes (Signed)
Pt sleeping@this time. Breathing even and unlabored. Will continue to monitor for safety 

## 2021-05-18 ENCOUNTER — Other Ambulatory Visit: Payer: Self-pay

## 2021-05-18 ENCOUNTER — Encounter (HOSPITAL_COMMUNITY): Payer: Self-pay

## 2021-05-18 ENCOUNTER — Encounter (HOSPITAL_COMMUNITY): Payer: Self-pay | Admitting: Registered Nurse

## 2021-05-18 ENCOUNTER — Inpatient Hospital Stay (HOSPITAL_COMMUNITY)
Admission: AD | Admit: 2021-05-18 | Discharge: 2021-05-23 | DRG: 885 | Disposition: A | Discharge status: Home / Self Care | Payer: BC Managed Care – PPO | Source: Intra-hospital | Attending: Psychiatry | Admitting: Psychiatry

## 2021-05-18 DIAGNOSIS — F429 Obsessive-compulsive disorder, unspecified: Secondary | ICD-10-CM | POA: Diagnosis present

## 2021-05-18 DIAGNOSIS — Z818 Family history of other mental and behavioral disorders: Secondary | ICD-10-CM | POA: Diagnosis not present

## 2021-05-18 DIAGNOSIS — F332 Major depressive disorder, recurrent severe without psychotic features: Secondary | ICD-10-CM | POA: Diagnosis present

## 2021-05-18 DIAGNOSIS — K30 Functional dyspepsia: Secondary | ICD-10-CM | POA: Diagnosis present

## 2021-05-18 DIAGNOSIS — R45851 Suicidal ideations: Secondary | ICD-10-CM | POA: Diagnosis present

## 2021-05-18 DIAGNOSIS — F411 Generalized anxiety disorder: Secondary | ICD-10-CM | POA: Diagnosis present

## 2021-05-18 DIAGNOSIS — Z79899 Other long term (current) drug therapy: Secondary | ICD-10-CM | POA: Diagnosis not present

## 2021-05-18 LAB — COMPREHENSIVE METABOLIC PANEL
ALT: 22 U/L (ref 0–44)
ALT: 22 U/L (ref 0–44)
AST: 20 U/L (ref 15–41)
AST: 21 U/L (ref 15–41)
Albumin: 4.3 g/dL (ref 3.5–5.0)
Albumin: 4.1 g/dL (ref 3.5–5.0)
Alkaline Phosphatase: 68 U/L (ref 38–126)
Alkaline Phosphatase: 70 U/L (ref 38–126)
Anion gap: 7 (ref 5–15)
Anion gap: 8 (ref 5–15)
BUN: 18 mg/dL (ref 6–20)
BUN: 23 mg/dL — ABNORMAL HIGH (ref 6–20)
CO2: 24 mmol/L (ref 22–32)
CO2: 24 mmol/L (ref 22–32)
Calcium: 9.2 mg/dL (ref 8.9–10.3)
Calcium: 9.3 mg/dL (ref 8.9–10.3)
Chloride: 106 mmol/L (ref 98–111)
Chloride: 106 mmol/L (ref 98–111)
Creatinine, Ser: 0.79 mg/dL (ref 0.61–1.24)
Creatinine, Ser: 0.82 mg/dL (ref 0.61–1.24)
GFR, Estimated: >60 mL/min (ref >60)
GFR, Estimated: >60 mL/min (ref >60)
Glucose, Bld: 105 mg/dL — ABNORMAL HIGH (ref 70–99)
Glucose, Bld: 154 mg/dL — ABNORMAL HIGH (ref 70–99)
Potassium: 4.3 mmol/L (ref 3.5–5.1)
Potassium: 3.8 mmol/L (ref 3.5–5.1)
Sodium: 137 mmol/L (ref 135–145)
Sodium: 138 mmol/L (ref 135–145)
Total Bilirubin: 0.9 mg/dL (ref 0.3–1.2)
Total Bilirubin: 0.5 mg/dL (ref 0.3–1.2)
Total Protein: 7.8 g/dL (ref 6.5–8.1)
Total Protein: 7.9 g/dL (ref 6.5–8.1)

## 2021-05-18 LAB — CBC
HCT: 49.5 % (ref 39.0–52.0)
Hemoglobin: 17 g/dL (ref 13.0–17.0)
MCH: 31 pg (ref 26.0–34.0)
MCHC: 34.3 g/dL (ref 30.0–36.0)
MCV: 90.2 fL (ref 80.0–100.0)
Platelets: 327 10*3/uL (ref 150–400)
RBC: 5.49 MIL/uL (ref 4.22–5.81)
RDW: 12.7 % (ref 11.5–15.5)
WBC: 7.2 10*3/uL (ref 4.0–10.5)
nRBC: 0 % (ref 0.0–0.2)

## 2021-05-18 LAB — LIPID PANEL
Cholesterol: 190 mg/dL (ref 0–200)
HDL: 61 mg/dL (ref >40)
LDL Cholesterol: 118 mg/dL — ABNORMAL HIGH (ref 0–99)
Total CHOL/HDL Ratio: 3.1 RATIO
Triglycerides: 54 mg/dL (ref <150)
VLDL: 11 mg/dL (ref 0–40)

## 2021-05-18 LAB — HEMOGLOBIN A1C
Hgb A1c MFr Bld: 5.5 % (ref 4.8–5.6)
Mean Plasma Glucose: 111.15 mg/dL

## 2021-05-18 LAB — VITAMIN D 25 HYDROXY (VIT D DEFICIENCY, FRACTURES): Vit D, 25-Hydroxy: 30.47 ng/mL (ref 30–100)

## 2021-05-18 LAB — T4, FREE: Free T4: 0.77 ng/dL (ref 0.61–1.12)

## 2021-05-18 LAB — MAGNESIUM: Magnesium: 2.1 mg/dL (ref 1.7–2.4)

## 2021-05-18 LAB — TSH: TSH: 4.737 u[IU]/mL — ABNORMAL HIGH (ref 0.350–4.500)

## 2021-05-18 MED ORDER — HYDROXYZINE HCL 25 MG PO TABS
25.0000 mg | ORAL_TABLET | Freq: Three times a day (TID) | ORAL | Status: DC | PRN
  Filled 2021-05-18 (×2): qty 1

## 2021-05-18 MED ORDER — TRAZODONE HCL 50 MG PO TABS
50.0000 mg | ORAL_TABLET | Freq: Every evening | ORAL | Status: DC | PRN
  Filled 2021-05-18 (×2): qty 1

## 2021-05-18 MED ORDER — SERTRALINE HCL 50 MG PO TABS
150.0000 mg | ORAL_TABLET | Freq: Every day | ORAL | Status: DC
  Administered 2021-05-18 – 2021-05-19 (×2): 150 mg via ORAL
  Filled 2021-05-18 (×6): qty 3

## 2021-05-18 MED ORDER — MAGNESIUM HYDROXIDE 400 MG/5ML PO SUSP: 30.0000 mL | Freq: Every day | ORAL | Status: DC | PRN

## 2021-05-18 MED ORDER — CARIPRAZINE HCL 1.5 MG PO CAPS
1.5000 mg | ORAL_CAPSULE | Freq: Every day | ORAL | Status: DC
  Administered 2021-05-18: 1.5 mg via ORAL
  Filled 2021-05-18 (×5): qty 1

## 2021-05-18 MED ORDER — ALUM & MAG HYDROXIDE-SIMETH 200-200-20 MG/5ML PO SUSP: 30.0000 mL | ORAL | Status: DC | PRN

## 2021-05-18 MED ORDER — ACETAMINOPHEN 325 MG PO TABS: 650.0000 mg | ORAL_TABLET | Freq: Four times a day (QID) | ORAL | Status: DC | PRN

## 2021-05-18 NOTE — ED Notes (Signed)
Report given to [facility] RN at [facility] adult unit  ?

## 2021-05-18 NOTE — Group Note (Signed)
Recreation Therapy Group Note ? ? ?Group Topic:Team Building  ?Group Date: 05/18/2021 ?Start Time: 0930 ?End Time: 1000 ?Facilitators: Caroll Rancher, LRT,CTRS ?Location: [address] Dayroom ? ? ?Goal Area(s) Addresses:  ?Patient will effectively work with peer towards shared goal.  ?Patient will identify skills used to make activity successful.  ?Patient will identify how skills used during activity can be applied to reach post d/c goals.  ? ?Group Description: Tallest [employer]. In teams of 3-4, patients were given 25 small craft pipe cleaners. Using the materials provided, patients were instructed to compete again the opposing team(s) to build the tallest free-standing structure from floor level. The activity was timed; difficulty increased by Clinical research associate as Production designer, theatre/television/film continued.  Systematically resources were removed with additional directions for example, placing one arm behind their back, working in silence, and shape stipulations. LRT facilitated post-activity discussion reviewing team processes and necessary communication skills involved in completion. Patients were encouraged to reflect how the skills utilized, or not utilized, in this activity can be incorporated to positively impact support systems post discharge. ? ? ?Affect/Mood: N/A ?  ?Participation Level: Did not attend ?  ? ?Clinical Observations/Individualized Feedback:   ? ? ?Plan: Continue to engage patient in RT group sessions 2-3x/week. ? ? ?Caroll Rancher, LRT,CTRS ?05/18/2021 1:04 PM ?

## 2021-05-18 NOTE — Progress Notes (Signed)
Adult Psychoeducational Group Note ? ?Date:  05/18/2021 ?Time:  5:23 PM ? ?Group Topic/Focus:  ?Goals Group:   The focus of this group is to help patients establish daily goals to achieve during treatment and discuss how the patient can incorporate goal setting into their daily lives to aide in recovery. ? ?Participation Level:  Active ? ?Participation Quality:  Appropriate ? ?Affect:  Appropriate ? ?Cognitive:  Appropriate ? ?Insight: Appropriate ? ?Engagement in Group:  Engaged ? ?Modes of Intervention:  Discussion ? ?Additional Comments:  Patient attended morning orientation/goal setting group and participated.  ? ? ?Terrill Alperin W Molinda Bailiff ?[id number], 5:23 PM ?

## 2021-05-18 NOTE — Progress Notes (Signed)
Patient ID: Mark Hanson, male   DOB: July 22, 1970, 51 y.o.   MRN: [id number] ?Admission Note:  ?51 yr male who presents [facility] in no acute distress for the treatment of SI/ anxiety and Depression. Pt appears flat and depressed. Pt was calm and cooperative with admission process. Pt presents with passive SI and contracts for safety upon admission. Pt denies AVH / HI / pain . Pt stated he stopped his meds on his own and then started them back after being off them 1.42yrs. pt stated the doctor adjusted his medications , then he started decompensating . Pt stated he doctor adjusted his medications again, but pt has not been able to take the new doses .  ? ?Per Assessment: ?51 year old male that presents this date with ongoing anxiety associated with his current employment. Patient is currently receiving OP [facility] where he sees Mark Laine NP that assists with medication management. Patient has concerns over recent medication changes (Patient cannot recall medications but is speaking with wife out front to assist) once he is roomed. 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 Patient is also an Gaffer and reports he feels guilty at times due to not having "stronger faith." Patient has been diagnosed with MDD and GAD stating he has only started back on medications in the last 2 months after being off them for over a year. ? ? presents to this office today for follow up and medication management. Unlike last visit with significant progress, he presents today withdrawn and distressed. He says, "I don't know why I keep going through this". He brought written list of things that were wrong today. He says "Things are so bad, I am afraid, I am fearful of myself". He acknowledges that he was doing fantastic last visit and he felt [name] was working well. He was  smiling and happy but not 100%. Since we increased the Vraylar to 3 mg, he feels he is not sure what happened and has been declining emotionally.  He feels more anxiety, fearful, and paralyzed. Says that he woke his wife up to pray for him and kept her awake for two hours. This morning he felt if he was having a panic  attack. His chest felt heavy and was full of fear. Says he feels strong since of doom. Says he once again is feeling suicidal like there is no hope. 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 When asked if he feels safe and that he will not harm self, he hesitates and says "I feel like I cannot guarantee that". Feels like if he goes back to work he may harm himself. I asked him if he felt like he needed to be hospitalized or evaluated further and he says yes. He wanted to contact wife and notify her of his intentions and did not feel safe to drive. She was notified and is picking him up to drive to [facility]. He has concerns that he cannot be alone with any women. That this spiritually set him back because he cannot prevent lusting for them. Says he continues to fail in this area, although he prays about it constantly and ask God for strength. He distraught because he  sometimes cannot control this in church and feels convicted.He did not want to be observed by male staff in the waiting room while waiting for his wife for transport.  He endorses racing thoughts, panic, lack of concentration, irritability and rapid mood swings. ? ?A: Skin was assessed and found to be clear of any abnormal marks apart from a surgical scar on L abdomen, multiple tattoos on.  PT searched and no contraband found, POC and unit policies explained and understanding verbalized. Consents obtained. Food and fluids offered, and fluids accepted.  ? ?R:Pt had no additional questions or concerns.  ?

## 2021-05-18 NOTE — BHH Counselor (Signed)
Adult Comprehensive Assessment ? ?Patient ID: Mark Hanson, male   DOB: 1970/05/31, 51 y.o.   MRN: [id number] ? ?Information Source: ?Information source: Patient ? 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 ?Living/Environment/Situation:  ?Living Arrangements: Spouse/significant other, Children ?Living conditions (as described by patient or guardian): Own House ?Who else lives in the home?: Wife and son ?How long has patient lived in current situation?: 25 years ?What is atmosphere in current home: Comfortable, Supportive ? ?Family History:  ?Marital status: Married ?Number of Years Married: 26 ?What types of issues is patient dealing with in the relationship?: Pt does not wish to answer this questions ?Are you sexually active?: Yes ?What is your sexual orientation?: Heterosexual ?Has your sexual activity been affected by drugs, alcohol, medication, or emotional stress?: Pt reports medications are having side effects but will not disclose any additional information ?Does patient have children?: Yes ?How many children?: 2 ?How is patient's relationship with their children?: "I have a 22yo son and a 30yo step-daughter.  We  have an amazing relationship". ? ?Childhood History:  ?By whom was/is the patient raised?: Mother ?Additional childhood history information: Pt reports visiting his father every other weekend. ?Description of patient's relationship with caregiver when they were a child: "I am a momma's boy and we were very close" ?Patient's description of current relationship with people who raised him/her: "We have an amazing relationship now and we are still very close" ?How were you disciplined when you got in trouble as a child/adolescent?: Spankings and groundings ?Does patient have siblings?: Yes ?Number of Siblings: 2 ?Description of patient's current relationship with siblings: "I have 2 step-brothers and we get along great" ?Did patient suffer any verbal/emotional/physical/sexual abuse as a child?: No ?Did patient suffer from severe childhood neglect?: No ?Has patient ever been sexually abused/assaulted/raped as an adolescent or adult?: No ?Was the patient ever a victim of a crime or a disaster?: No ?Witnessed domestic violence?: No ?Has patient been affected by domestic violence as an adult?: No ? ?Education:  ?Highest grade of school patient has completed: 12th grade, Bachelor Degree in Children and Youth Ministries ?Currently a student?: No ?Learning disability?: Yes ?What learning problems does patient have?: ADHD ? 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 ?Financial Resources:   ?Financial resources: Income from employment, Private insurance ?Does patient have a representative payee or guardian?: No ? ?Alcohol/Substance Abuse:   ?What has been your use of drugs/alcohol within the last 12 months?: Pt denies all substance use ?If attempted suicide, did drugs/alcohol play a role in this?: No ?Alcohol/Substance Abuse Treatment Hx: Denies past history ?Has alcohol/substance abuse ever caused legal problems?: No ? ?Social Support System:   ?Patient's Community Support System: Good ?Describe Community Support System: God, wife, mother, and church ?Type of faith/religion: Pentecostalism ?How does patient's faith help to cope with current illness?: Church, prayer, and reading the Bible ? ?Leisure/Recreation:   ?Do You Have Hobbies?: Yes ?Leisure and Hobbies: Working out and spending time with family ? ?Strengths/Needs:   ?What is the patient's perception of their strengths?: Encouragement and Service ?Patient states they can use these personal strengths during their treatment to contribute to their recovery: "Even the encourager needs encouragement" ?Patient states these barriers may affect/interfere with their treatment: None ?Patient states these barriers may affect their return to the community: None ?Other important information patient would like considered in planning for their treatment: None ? ?Discharge Plan:   ?Currently receiving community mental health services: Yes (From Whom) (Medication Management with [facility]) ?Patient states concerns and preferences for aftercare planning are: Pt would like to remain with current outpatient provider and would like to add therapy to his services there. ?Patient states they will know when they are safe and ready for discharge when: "When I feel safe and more stable" ?Does patient have access to transportation?: Yes (Own car at home.) ?Does patient have financial barriers  related to discharge medications?: No ?Will patient be returning to same living situation after discharge?: Yes ? ?Summary/Recommendations:   ?Summary and Recommendations (to be completed by the evaluator): Termaine Roupp is a 51 year old, male, who was admitted to the hospital due to worsening anxiety and suicidal thoughts.  The Pt states that his anxiety has been increasing for the past 2 weeks and that he stopped taking his medications 1 and a half years ago.  He states that he would like to be back on his antidepression medications while in the hospital.  The Pt reports that he is living in his own home with his wife and 22yo son.  He denies any childhood trauma or abuse and states that he has a good relationship with his family members.  However he does report having few social supports.  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  The Pt reports working at [employer] and states that he rides on the back of the truck.  He states that he is afraid to complete his work due to his suicidal thoughts.  He states receives [employer] from his employment and additional financial support from his spouse.  The Pt denies all substance, as well as any current or previous substance use treatment.  While in the hospital the Pt can benefit from crisis stabilization, medication evaluation, group therapy, psycho-education, case management, and discharge planning.  Upon discharge the Pt would like to return to his home with his wife.  It is recommended that the Pt continue medication management services with his current outpatient provider at [facility] and that therapy services be added at this provider office as well. ? ?Aram Beecham.  05/18/2021 ?

## 2021-05-18 NOTE — Tx Team (Signed)
Initial Treatment Plan ?05/18/2021 ?3:40 AM ?[name] ?ION:[id number] ? ? ? ?PATIENT STRESSORS: ?Marital or family conflict   ?Medication change or noncompliance   ? ? ?PATIENT STRENGTHS: ?General fund of knowledge  ?Motivation for treatment/growth  ?Supportive family/friends  ? ? ?PATIENT IDENTIFIED PROBLEMS: ?Risk for SI  ?Medication non compliance / change  ?"Over coming fear of wanting to give up"  ?anxiety  ?depression  ?  ?  ?  ?  ?  ? ?DISCHARGE CRITERIA:  ?Improved stabilization in mood, thinking, and/or behavior ?Verbal commitment to aftercare and medication compliance ? ?PRELIMINARY DISCHARGE PLAN: ?Attend aftercare/continuing care group ?Attend PHP/IOP ?Outpatient therapy ? ?PATIENT/FAMILY INVOLVEMENT: ?This treatment plan has been presented to and reviewed with the patient, Mark Hanson.  The patient and family have been given the opportunity to ask questions and make suggestions. ? ?Delos Haring, RN ?05/18/2021, 3:40 AM ?

## 2021-05-18 NOTE — Group Note (Signed)
Date:  05/18/2021 ?Time:  1:04 PM ? ?Group Topic/Focus:  ?Goals Group:   The focus of this group is to help patients establish daily goals to achieve during treatment and discuss how the patient can incorporate goal setting into their daily lives to aide in recovery. ? ? ? ?Participation Level:  Active ? ?Participation Quality:  Appropriate ? ?Affect:  Appropriate ? ?Cognitive:  Appropriate ? ?Insight: Appropriate ? ?Engagement in Group:  Engaged ? ?Modes of Intervention:  Education ? ?Additional Comments:  Educated patient on ways to use "I statements" to get his needs met. Explained to patient that "I statements are statements that  explains how we feel, think, or explain an experience.  ? ?Jerrye Beavers ?05/18/2021, 1:04 PM ? ?

## 2021-05-18 NOTE — [facility] Suicide Risk Assessment (Signed)
Suicide Risk Assessment ? ?Admission Assessment    ?[facility] Admission Suicide Risk Assessment ? ? ?Nursing information obtained from:  Patient ?Demographic factors:  Male, Caucasian ?Current Mental Status:  Suicidal ideation indicated by patient ?Loss Factors:  NA ?Historical Factors:  NA ?Risk Reduction Factors:  Employed, Living with another person, especially a relative ? ?Total Time spent with patient: 1 hour ?Principal Problem: MDD (major depressive disorder), recurrent severe, without psychosis (HCC) ?Diagnosis:  Principal Problem: ?  MDD (major depressive disorder), recurrent severe, without psychosis (HCC) ?Active Problems: ?  OCD (obsessive compulsive disorder) ?  GAD (generalized anxiety disorder) ?  MDD (major depressive disorder), recurrent episode, severe (HCC) ? 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 Pt was transferred to this [facility] for treatment and stabilization of his mood. This is pt's first inpatient behavioral health related admission. ? ?Continued Clinical Symptoms: MDD, severe anxiety & OCD. Pt in need of continouous hospitalization for treatment and stabilization of his mood. ? ?Alcohol Use Disorder Identification Test Final Score (AUDIT): 0 ?The "Alcohol Use Disorders Identification Test", Guidelines for Use in Primary Care, Second Edition.  World Science writer [facility]). ?Score between 0-7:  no or low risk or alcohol related problems. ?Score between 8-15:  moderate risk of alcohol related problems. ?Score between 16-19:  high risk of alcohol related problems. ?Score 20 or above:  warrants further diagnostic evaluation for alcohol dependence and treatment. ? ?CLINICAL FACTORS:  ? Depression:   Anhedonia ?Hopelessness ? ?Musculoskeletal: ?Strength & Muscle Tone: within  normal limits ?Gait & Station: normal ?Patient leans: N/A ? ?Psychiatric Specialty Exam: ? ?Presentation  ?General Appearance: Appropriate for Environment; Fairly Groomed ? ?Eye Contact:Fair ? ?Speech:Clear and Coherent ? ?Speech Volume:Normal ? ?Handedness:Right ? ?Mood and Affect  ?Mood:Anxious; Depressed ? ?Affect:Congruent ? ?Thought Process  ?Thought Processes:Coherent ? ?Descriptions of Associations:Intact ? ?Orientation:Full (Time, Place and Person) ? ?Thought Content:Logical ? ?History of Schizophrenia/Schizoaffective disorder:No ? ?Duration of Psychotic Symptoms:No data recorded ?Hallucinations:Hallucinations: None ? ?Ideas of Reference:None ? ?Suicidal Thoughts:Suicidal Thoughts: No ?SI Active Intent and/or Plan: With Intent; With Plan ? ?Homicidal Thoughts:Homicidal Thoughts: No ? ?Sensorium  ?Memory:Immediate Good; Recent Good; Remote Poor ? ?Judgment:Fair ? ?Insight:Poor ? ?Executive Functions  ?Concentration:Fair ? ?Attention Span:Fair ? ?Recall:Fair ? ?Fund of Knowledge:Fair ? ?Language:Fair ? ?Psychomotor Activity  ?Psychomotor Activity:Psychomotor Activity: Normal ? ?Assets  ?Assets:Communication Skills ? ?Sleep  ?Sleep:Sleep: Good ? ?Physical Exam: ?Physical Exam ?Constitutional:   ?   Appearance: Normal appearance.  ?HENT:  ?   Head: Normocephalic.  ?   Nose: Nose normal.  ?Eyes:  ?   Pupils: Pupils are equal, round, and reactive to light.  ?Pulmonary:  ?   Effort: Pulmonary effort is normal.  ?Musculoskeletal:     ?   General: Normal range of motion.  ?   Cervical back: Normal range of motion.  ?Neurological:  ?   Mental Status: He is alert and oriented to person, place, and time.  ? ?Review of Systems  ?Constitutional: Negative.   ?HENT: Negative.    ?Eyes: Negative.   ?Respiratory: Negative.    ?Cardiovascular: Negative.   ?Gastrointestinal: Negative.   ?Genitourinary: Negative.   ?Musculoskeletal: Negative.   ?Skin: Negative.   ?Neurological: Negative.   ?Endo/Heme/Allergies:   ?  Allergy ?Penicillins  ?Psychiatric/Behavioral:  Positive for depression. Negative for hallucinations, substance abuse and suicidal ideas. The patient is nervous/anxious. The patient does not have insomnia.   ?Blood pressure 110/75, pulse 84, temperature 98.2 ?F (36.8 ?C), temperature source Oral, resp. rate 18, height 5\' 10"  (1.778 m), weight 81.6 kg, SpO2 97 %. Body mass index is 25.83 kg/m?. ? ? ?COGNITIVE FEATURES THAT CONTRIBUTE TO RISK:  ?None   ? ?SUICIDE RISK:  ? Moderate:  Frequent suicidal ideation with limited intensity, and duration, some specificity in terms of plans, no associated intent, good self-control, limited dysphoria/symptomatology, some risk factors present, and identifiable protective factors, including available and accessible social support. ? ?PLAN OF CARE:  ? ?Daily contact with patient to assess and evaluate symptoms and progress in treatment and Medication management ?  ?Observation Level/Precautions:  15 minute checks  ?Laboratory:  Labs reviewed   ?Psychotherapy:  Unit Group sessions  ?Medications:  See MAR  ?Consultations:  To be determined   ?Discharge Concerns:  Safety, medication compliance, mood stability  ?Estimated LOS: 5-7 days  ?Other:  N/A  ?  ?Physician Treatment Plan for Primary Diagnosis: MDD (major depressive disorder), recurrent severe, without psychosis (HCC) ?  ?PLAN ?Safety and Monitoring: ?Voluntary admission to inpatient psychiatric unit for safety, stabilization and treatment ?Daily contact with patient to assess and evaluate symptoms and progress in treatment ?Patient's case to be discussed in multi-disciplinary team meeting ?Observation Level : q15 minute checks ?Vital signs: q12 hours ?Precautions: suicide, elopement, and assault ?  ?Long Term Goal(s): Improvement in symptoms so as ready for discharge ?  ?Short Term Goals: Ability to verbalize feelings will improve, Ability to disclose and discuss suicidal ideas, Ability to demonstrate self-control will improve,  Ability to maintain clinical measurements within normal limits will improve, and Compliance with prescribed medications will improve ?  ?                  Diagnoses ?Principal Problem: ?  MDD (major depressive disorder), recurrent severe, without psychosis (HCC) ?Active Problems: ?  OCD (obsessive compulsive disorder) ?  GAD (generalized anxiety disorder) ?  MDD (major depressive disorder), recurrent episode, severe (HCC) ?  ?                  Medications ?-Continue Zoloft 150 mg daily for MDD (-home med-not agreeable to change) ?-Continue Vraylar 1.5 mg daily for Mood stabilization- (home med, not agreeable to change) ?-Continue Hydroxyzine 25 mg every 6 hours PRN ?  ?Other PRNS ?-Continue Tylenol 650 mg every 6 hours PRN for mild pain ?-Continue Maalox 30 mg every 4 hrs PRN for indigestion ?-Continue Milk of Magnesia as needed every 6 hrs for constipation ?  ?Labs: Tox screen negative, TSH slightly elevated at 4.737, FT3, Ft4 ordered. CBC WNL. EKG with QTC of 431. CMP, lipid profile, Mg, Vit D, Vit B1 ordered ?  ?Discharge Planning: ?Social work and case management to assist with discharge planning and identification of hospital follow-up needs prior to discharge ?Estimated LOS: 5-7 days ?Discharge Concerns: Need to establish a safety plan; Medication compliance and effectiveness ?Discharge Goals: Return home with outpatient referrals for mental health follow-up including medication management/psychotherapy ?  ? ?I certify that inpatient services furnished can reasonably be expected to improve the patient's condition.  ? ? , NP ?05/18/2021, 3:51 PM ?

## 2021-05-18 NOTE — [facility] Suicide Risk Assessment (Signed)
[facility] INPATIENT:  Family/Significant Other Suicide Prevention Education ? ?Suicide Prevention Education:  ?Patient Refusal for Family/Significant Other Suicide Prevention Education: The patient Mark Hanson has refused to provide written consent for family/significant other to be provided Family/Significant Other Suicide Prevention Education during admission and/or prior to discharge.  Physician notified. ? ?Darleen Crocker ?05/18/2021, 11:22 AM ?

## 2021-05-18 NOTE — Group Note (Signed)
LCSW Group Therapy Note ? ? ?Group Date: 05/18/2021 ?Start Time: 1300 ?End Time: 1400 ? ?Type of Therapy and Topic:  Group Therapy:  Self-Care after Hospitalization ? ?Participation Level:  Active  ? ?Description of Group ?This process group involved patients discussing how they plan to take care of themselves in a better manner when they get home from the hospital.  The group started with patients listing one healthy and one unhealthy way they took care of themselves prior to hospitalization.  A discussion ensued about the differences in healthy and unhealthy coping skills.  Group members shared ideas about making changes when they return home so that they can stay well and in recovery.  The white board was used to list ideas so that patients can continue to see these ideas throughout the day. ? ?Therapeutic Goals ?Patient will identify and describe one healthy and one unhealthy coping technique used prior to hospitalization ?Patient will participate in generating ideas about healthy self-care options when they return to the community ?Patients will be supportive of one another and receive said support from others ?Patient will identify one healthy self-care activity to add to his/her post-hospitalization life that can help in recovery ? ?Summary of Patient Progress:  The patient expressed that prior to hospitalization some healthy self-care activity they engaged in was exercising.  Patient's participated in group and was appropriate with their peers.   Patient accepted all worksheets and followed along with all group discussions.  ? ? ?Therapeutic Modalities ?Brief Solution-Focused Therapy ?Motivational Interviewing ?Psychoeducation ? ?Darleen Crocker, LCSWA ?05/18/2021  2:06 PM   ? ?

## 2021-05-18 NOTE — H&P (Addendum)
Psychiatric Admission Assessment Adult ? ?Patient Identification: Mark Hanson ?MRN:  [id number] ?Date of Evaluation:  05/18/2021 ?Chief Complaint:  MDD (major depressive disorder), recurrent episode, severe (HCC) [F33.2] ?Principal Diagnosis: MDD (major depressive disorder), recurrent severe, without psychosis (HCC) ?Diagnosis:  Principal Problem: ?  MDD (major depressive disorder), recurrent severe, without psychosis (HCC) ?Active Problems: ?  OCD (obsessive compulsive disorder) ?  GAD (generalized anxiety disorder) ?  MDD (major depressive disorder), recurrent episode, severe (HCC) ? 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 Pt was transferred to this [facility] for treatment and stabilization of his mood. This is pt's first inpatient behavioral health related admission. ? ?During this encounter, pt read from a piece of paper regarding the reason for this hospitalization, and stated: "I get up each day at 2 am. It's a struggle to get through my morning devotions. I have to clock in at work at 250 am, and I fear driving in the dark. Lately, I've been having thoughts of letting go, and falling off the truck. 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 He reports hating the current job, and states that it is a stressor. Pt refuses to discuss other stressors with this provider, stating that he does not feel comfortable sharing it with a male. ? ?Pt reports that he was on Zoloft for many years (did not remember how long), and it was working well, but he stopped it 1.5 years ago, and it was  restarted 2-2.5 months ago, along with Wellsite geologist. He reports that the dose of his [name] was also recently increased to 3 mg and that is when his suicide thoughts worsened. He reports reports that yesterday, his Psychiatrist decreased the Vraylar to 1.5 mg daily and increased Zoloft from 100 mg to 150 mg daily. He currently refuses any alterations in the  doses of these medications, and refuses any medication changes while here at [facility]. ? ?Pt is denies insomnia, denies changes in his appetite, denies any changes in his energy level, denies any manic type symptoms, denies AVH/HI, and his anxiety seems to be primarily related to his job. He reports that he is considering changing jobs. He reports past diagnoses of MDD, GAD, ADHD & social anxiety, and states that he sees [name] at [facility] for medication management. He reports a history of being bullied while younger for being skinny, reports a history of a concussion, reports a history of an accident where he hit his head and was told that had "brain damage". Questionable history of traumatic brain injury. He denies any past seizures, denies any medical conditions, denies any history self injurious behaviors, denies any history of recreational drug use/alcohol use. He denies any manic type symptoms. Will continue pt's home medications as listed below, since he does not want any medication changes. ? ?Associated Signs/Symptoms: ?Depression Symptoms:  suicidal thoughts with specific plan, ?anxiety, ?Duration of Depression Symptoms: Less than two weeks ? ?(Hypo) Manic Symptoms:   n/a ?Anxiety Symptoms:  Excessive Worry, ?Social Anxiety, ?Psychotic Symptoms:   n/a ?  PTSD Symptoms: ?Had a traumatic exposure:  history of being bullied in school ?Total Time spent with patient: 1 hour ? ?Past Psychiatric History: MDD, social anxiety, ADHD ? ?Is the patient at risk to self? Yes.    ?Has the patient been a risk to self in the past 6 months? Yes.    ?Has the patient been a  risk to self within the distant past? No.  ?Is the patient a risk to others? No.  ?Has the patient been a risk to others in the past 6 months? No.  ?Has the patient been a risk to others within the distant past? No.  ? ?Prior Inpatient Therapy:  no ?Prior Outpatient Therapy:  yes ? ?Alcohol Screening: 1. How often do you have a drink containing alcohol?: Never ?2. How many drinks containing alcohol do you have on a typical day when you are drinking?: 1 or 2 ?3. How often do you have six or more drinks on one occasion?: Never ?AUDIT-C Score: 0 ?4. How often during the last year have you found that you were not able to stop drinking once you had started?: Never ?5. How often during the last year have you failed to do what was normally expected from you because of drinking?: Never ?6. How often during the last year have you needed a first drink in the morning to get yourself going after a heavy drinking session?: Never ?7. How often during the last year have you had a feeling of guilt of remorse after drinking?: Never ?8. How often during the last year have you been unable to remember what happened the night before because you had been drinking?: Never ?9. Have you or someone else been injured as a result of your drinking?: No ?10. Has a relative or friend or a doctor or another health worker been concerned about your drinking or suggested you cut down?: No ?Alcohol Use Disorder Identification Test Final Score (AUDIT): 0 ?Substance Abuse History in the last 12 months:  No. ?Consequences of Substance Abuse: ?NA ?Previous Psychotropic Medications: Yes  ?Psychological Evaluations: No  ?Past Medical History:  ?Past Medical History:  ?Diagnosis Date  ? ADD (attention deficit disorder)   ? History reviewed. No pertinent surgical history. ?Family History: History reviewed. No pertinent family history. ?Family Psychiatric  History: yes-MDD in mother and grandfather ?Tobacco Screening:  no ?Social History:  ?Social History   ? ?Substance and Sexual Activity  ?Alcohol Use No  ?   ?Social History  ? ?Substance and Sexual Activity  ?Drug Use No  ?  ?Additional Social History: ?Marital status: Married ?Number of Years Married: 26 ?What types of issues is patient dealing with in the relationship?: Pt does not wish to answer this questions ?Are you sexually active?: Yes ?What is your sexual orientation?: Heterosexual ?Has your sexual activity been affected by drugs, alcohol, medication, or emotional stress?: Pt reports medications are having side effects but will not disclose any additional information ?Does patient have children?: Yes ?How many children?: 2 ?How is patient's relationship with their children?: "I have a 22yo son and a 30yo step-daughter.  We have an amazing relationship". ?   ?Allergies:  No Known Allergies ?Lab Results:  ?Results for orders placed or performed during the hospital encounter of 05/18/21 (from the past 48 hour(s))  ?CBC     Status: None  ? Collection Time: 05/18/21  6:13 AM  ?Result Value Ref Range  ? WBC 7.2 4.0 - 10.5 K/uL  ? RBC 5.49 4.22 -  5.81 MIL/uL  ? Hemoglobin 17.0 13.0 - 17.0 g/dL  ? HCT 49.5 39.0 - 52.0 %  ? MCV 90.2 80.0 - 100.0 fL  ? MCH 31.0 26.0 - 34.0 pg  ? MCHC 34.3 30.0 - 36.0 g/dL  ? RDW 12.7 11.5 - 15.5 %  ? Platelets 327 150 - 400 K/uL  ? nRBC 0.0 0.0 - 0.2 %  ?  Comment: Performed at [facility], [address] [place], [place] [address]  ?Comprehensive metabolic panel     Status: Abnormal  ? Collection Time: 05/18/21  6:13 AM  ?Result Value Ref Range  ? Sodium 137 135 - 145 mmol/L  ? Potassium 4.3 3.5 - 5.1 mmol/L  ? Chloride 106 98 - 111 mmol/L  ? CO2 24 22 - 32 mmol/L  ? Glucose, Bld 105 (H) 70 - 99 mg/dL  ?  Comment: Glucose reference range applies only to samples taken after fasting for at least 8 hours.  ? BUN 18 6 - 20 mg/dL  ? Creatinine, Ser 0.79 0.61 - 1.24 mg/dL  ? Calcium 9.2 8.9 - 10.3 mg/dL  ? Total Protein 7.8 6.5 - 8.1 g/dL  ? Albumin 4.3 3.5 - 5.0  g/dL  ? AST 20 15 - 41 U/L  ? ALT 22 0 - 44 U/L  ? Alkaline Phosphatase 68 38 - 126 U/L  ? Total Bilirubin 0.9 0.3 - 1.2 mg/dL  ? GFR, Estimated >60 >60 mL/min  ?  Comment: (NOTE) ?Calculated using the CKD-EPI Crea

## 2021-05-18 NOTE — Progress Notes (Signed)
?   05/18/21 0604  ?Sleep  ?Number of Hours 2.5  ? ? ?

## 2021-05-18 NOTE — BH IP Treatment Plan (Signed)
Interdisciplinary Treatment and Diagnostic Plan Update ? ?05/18/2021 ?Time of Session: 9:35am  ?Mark Hanson ?MRN: [id number] ? ?Principal Diagnosis: MDD (major depressive disorder), recurrent episode, severe ([place]) ? ?Secondary Diagnoses: Principal Problem: ?  MDD (major depressive disorder), recurrent episode, severe ([place]) ? ? ?Current Medications:  ?Current Facility-Administered Medications  ?Medication Dose Route Frequency Provider Last Rate Last Admin  ? acetaminophen (TYLENOL) tablet 650 mg  650 mg Oral Q6H PRN Rozetta Nunnery, NP      ? alum & mag hydroxide-simeth (MAALOX/MYLANTA) 200-200-20 MG/5ML suspension 30 mL  30 mL Oral Q4H PRN Rozetta Nunnery, NP      ? cariprazine (VRAYLAR) capsule 1.5 mg  1.5 mg Oral Daily Nkwenti, Doris, NP      ? hydrOXYzine (ATARAX) tablet 25 mg  25 mg Oral TID PRN Rozetta Nunnery, NP      ? magnesium hydroxide (MILK OF MAGNESIA) suspension 30 mL  30 mL Oral Daily PRN Lindon Romp A, NP      ? sertraline (ZOLOFT) tablet 150 mg  150 mg Oral Daily Nicholes Rough, NP   150 mg at 05/18/21 1105  ? ?PTA Medications: ?Medications Prior to Admission  ?Medication Sig Dispense Refill Last Dose  ? cariprazine (VRAYLAR) 1.5 MG capsule Take 1 capsule (1.5 mg total) by mouth daily. (Patient taking differently: Take 3 mg by mouth every evening.) 30 capsule 0   ? sertraline (ZOLOFT) 100 MG tablet Take 1 tablet (100 mg total) by mouth daily. 30 tablet 1   ? ? ?Patient Stressors: Marital or family conflict   ?Medication change or noncompliance   ? ?Patient Strengths: General fund of knowledge  ?Motivation for treatment/growth  ?Supportive family/friends  ? ?Treatment Modalities: Medication Management, Group therapy, Case management,  ?1 to 1 session with clinician, Psychoeducation, Recreational therapy. ? ? ?Physician Treatment Plan for Primary Diagnosis: MDD (major depressive disorder), recurrent episode, severe ([place]) ?Long Term Goal(s):    ? ?Short Term Goals:   ? ?Medication Management: Evaluate  patient's response, side effects, and tolerance of medication regimen. ? ?Therapeutic Interventions: 1 to 1 sessions, Unit Group sessions and Medication administration. ? ?Evaluation of Outcomes: Not Met ? ?Physician Treatment Plan for Secondary Diagnosis: Principal Problem: ?  MDD (major depressive disorder), recurrent episode, severe ([place]) ? ?Long Term Goal(s):    ? ?Short Term Goals:      ? ?Medication Management: Evaluate patient's response, side effects, and tolerance of medication regimen. ? ?Therapeutic Interventions: 1 to 1 sessions, Unit Group sessions and Medication administration. ? ?Evaluation of Outcomes: Not Met ? ? ?RN Treatment Plan for Primary Diagnosis: MDD (major depressive disorder), recurrent episode, severe ([place]) ?Long Term Goal(s): Knowledge of disease and therapeutic regimen to maintain health will improve ? ?Short Term Goals: Ability to remain free from injury will improve, Ability to participate in decision making will improve, Ability to verbalize feelings will improve, Ability to disclose and discuss suicidal ideas, and Ability to identify and develop effective coping behaviors will improve ? ?Medication Management: RN will administer medications as ordered by provider, will assess and evaluate patient's response and provide education to patient for prescribed medication. RN will report any adverse and/or side effects to prescribing provider. ? ?Therapeutic Interventions: 1 on 1 counseling sessions, Psychoeducation, Medication administration, Evaluate responses to treatment, Monitor vital signs and CBGs as ordered, Perform/monitor CIWA, COWS, AIMS and Fall Risk screenings as ordered, Perform wound care treatments as ordered. ? ?Evaluation of Outcomes: Not Met ? ? ?LCSW Treatment Plan for Primary Diagnosis:  MDD (major depressive disorder), recurrent episode, severe ([facility]) ?Long Term Goal(s): Safe transition to appropriate next level of care at discharge, Engage patient in therapeutic group  addressing interpersonal concerns. ? ?Short Term Goals: Engage patient in aftercare planning with referrals and resources, Increase social support, Increase emotional regulation, Facilitate acceptance of mental health diagnosis and concerns, Identify triggers associated with mental health/substance abuse issues, and Increase skills for wellness and recovery ? ?Therapeutic Interventions: Assess for all discharge needs, 1 to 1 time with Social worker, Explore available resources and support systems, Assess for adequacy in community support network, Educate family and significant other(s) on suicide prevention, Complete Psychosocial Assessment, Interpersonal group therapy. ? ?Evaluation of Outcomes: Not Met ? ? ?Progress in Treatment: ?Attending groups: Yes. ?Participating in groups: Yes. ?Taking medication as prescribed: Yes. ?Toleration medication: Yes. ?Family/Significant other contact made: No, will contact:  Declined Consents ?Patient understands diagnosis: Yes. ?Discussing patient identified problems/goals with staff: Yes. ?Medical problems stabilized or resolved: Yes. ?Denies suicidal/homicidal ideation: Yes. ?Issues/concerns per patient self-inventory: No. ? ? ?New problem(s) identified: No, Describe:  None  ? ?New Short Term/Long Term Goal(s): medication stabilization, elimination of SI thoughts, development of comprehensive mental wellness plan.  ? ?Patient Goals: "To relax and overcome fear" ? ?Discharge Plan or Barriers: Patient recently admitted. CSW will continue to follow and assess for appropriate referrals and possible discharge planning.  ? ?Reason for Continuation of Hospitalization: Depression ?Medication stabilization ?Suicidal ideation ? ?Estimated Length of Stay: 3 to 7 days  ? ?Last 3 [place] Suicide Severity Risk Score: ?Flowsheet Row [facility]Current[facility]  ?C-SSRS RISK  CATEGORY High Risk High Risk  ? ?  ? ? ?Last PHQ 2/9 Scores: ? ?  05/17/2021  ?  2:36 PM 05/17/2021  ?  1:11 PM 03/21/2021  ?  3:49 PM  ?Depression screen PHQ 2/9  ?Decreased Interest 3    ?Down, Depressed, Hopeless 3    ?PHQ - 2 Score 6    ?Altered sleeping 2    ?Tired, decreased energy 2    ?Change in appetite     ?Feeling bad or failure about yourself  3    ?Trouble concentrating 2    ?Moving slowly or fidgety/restless 0    ?Suicidal thoughts 3    ?PHQ-9 Score 18    ?Difficult doing work/chores Somewhat difficult    ?  ? Information is confidential and restricted. Go to Review Flowsheets to unlock data.  ? ? ?Scribe for Treatment Team: ? M , LCSWA ?05/18/2021 ?2:43 PM ? ? ?

## 2021-05-18 NOTE — Progress Notes (Signed)
?   05/18/21 0800  ?Psych Admission Type (Psych Patients Only)  ?Admission Status Voluntary  ?Psychosocial Assessment  ?Patient Complaints Depression;Anxiety  ?Eye Contact Brief  ?Facial Expression Flat  ?Affect Anxious;Depressed  ?Speech Logical/coherent  ?Interaction Assertive  ?Motor Activity Other (Comment) ?(wdl)  ?Appearance/Hygiene Unremarkable  ?Behavior Characteristics Anxious  ?Mood Depressed  ?Thought Process  ?Coherency Circumstantial  ?Content WDL  ?Delusions WDL  ?Perception WDL  ?Hallucination None reported or observed  ?Judgment Poor  ?Confusion None  ?Danger to Self  ?Current suicidal ideation? Passive  ?Self-Injurious Behavior No self-injurious ideation or behavior indicators observed or expressed   ?Agreement Not to Harm Self Yes  ?Description of Agreement Verbally contracts for safety  ? ? ?

## 2021-05-18 NOTE — BHH Group Notes (Signed)
PT attended NA and was attentive. ?

## 2021-05-19 MED ORDER — NORTRIPTYLINE HCL 10 MG PO CAPS
10.0000 mg | ORAL_CAPSULE | Freq: Every day | ORAL | Status: DC
  Administered 2021-05-20: 10 mg via ORAL
  Filled 2021-05-19 (×2): qty 1

## 2021-05-19 MED ORDER — CARIPRAZINE HCL 1.5 MG PO CAPS
1.5000 mg | ORAL_CAPSULE | Freq: Every day | ORAL | Status: AC
  Administered 2021-05-19 – 2021-05-20 (×2): 1.5 mg via ORAL
  Filled 2021-05-19 (×3): qty 1

## 2021-05-19 MED ORDER — NORTRIPTYLINE HCL 10 MG PO CAPS: 10.0000 mg | ORAL_CAPSULE | Freq: Every day | ORAL | Status: DC

## 2021-05-19 MED ORDER — SERTRALINE HCL 100 MG PO TABS
100.0000 mg | ORAL_TABLET | Freq: Every day | ORAL | Status: DC
  Administered 2021-05-20: 100 mg via ORAL
  Filled 2021-05-19 (×2): qty 1

## 2021-05-19 NOTE — Plan of Care (Signed)
?  Problem: Education: ?Goal: Knowledge of [facility] Education information/materials will improve ?Outcome: Progressing ?Goal: Emotional status will improve ?Outcome: Progressing ?Goal: Mental status will improve ?Outcome: Progressing ?Goal: Verbalization of understanding the information provided will improve ?Outcome: Progressing ?  ?Problem: Activity: ?Goal: Interest or engagement in activities will improve ?Outcome: Progressing ?Goal: Sleeping patterns will improve ?Outcome: Progressing ?  ?Problem: Safety: ?Goal: Periods of time without injury will increase ?Outcome: Progressing ?  ?Problem: Coping: ?Goal: Coping ability will improve ?Outcome: Progressing ?  ?Problem: Coping: ?Goal: Ability to interact with others will improve ?Outcome: Progressing ?  ?Problem: Activity: ?Goal: Imbalance in normal sleep/wake cycle will improve ?Outcome: Progressing ?  ?Problem: Coping: ?Goal: Coping ability will improve ?Outcome: Progressing ?Goal: Will verbalize feelings ?Outcome: Progressing ?  ?Problem: Self-Concept: ?Goal: Level of anxiety will decrease ?Outcome: Not Progressing ?  ?

## 2021-05-19 NOTE — BHH Group Notes (Signed)
Adult Psychoeducational Group Note ? ?Date:  05/19/2021 ?Time:  3:08 PM ? ?Group Topic/Focus:  ?Wellness Toolbox:   The focus of this group is to discuss various aspects of wellness, balancing those aspects and exploring ways to increase the ability to experience wellness.  Patients will create a wellness toolbox for use upon discharge. ? ?Participation Level:  Active ? ?Participation Quality:  Attentive ? ?Affect:  Appropriate ? ?Cognitive:  Alert ? ?Insight: Appropriate ? ?Engagement in Group:  Engaged ? ?Modes of Intervention:  Activity ? ?Additional Comments:  Patient attended and participated in the relaxation group activity. ? ?Jearl Klinefelter ?05/19/2021, 3:08 PM ?

## 2021-05-19 NOTE — Progress Notes (Addendum)
?  Pt presents with high anxiety and depression.  Pt was offered PRN medications, but declined.  Pt remains passive SI, with no HI or AVH.  Pt verbally contracts for safety. ? ?Pr is safe on the unit with Q 15 min safety checks. ? ? ? ? 05/18/21 2200  ?Psych Admission Type (Psych Patients Only)  ?Admission Status Voluntary  ?Psychosocial Assessment  ?Patient Complaints Anxiety;Depression  ?Eye Contact Brief  ?Facial Expression Flat  ?Affect Anxious;Depressed  ?Speech Logical/coherent  ?Interaction Assertive  ?Motor Activity Other (Comment) ?(WNL)  ?Appearance/Hygiene Unremarkable  ?Behavior Characteristics Anxious  ?Mood Depressed;Suspicious  ?Thought Process  ?Coherency Circumstantial  ?Content WDL  ?Delusions None reported or observed  ?Perception WDL  ?Hallucination None reported or observed  ?Judgment Poor  ?Confusion None  ?Danger to Self  ?Current suicidal ideation? Passive  ?Self-Injurious Behavior No self-injurious ideation or behavior indicators observed or expressed   ?Agreement Not to Harm Self Yes  ?Description of Agreement verbal contract for safety  ?Danger to Others  ?Danger to Others None reported or observed  ? ? ?

## 2021-05-19 NOTE — BHH Group Notes (Signed)
Goals Group  ?10-10 ?PT attended group and contributed to group  ?

## 2021-05-19 NOTE — Progress Notes (Signed)
BHH Group Notes:  (Nursing/MHT/Case Management/Adjunct) ? ?Date:  05/19/2021  ?Time:  2015 ? ?Type of Therapy:   wrap up group ? ?Participation Level:  Active ? ?Participation Quality:  Appropriate, Attentive, Sharing, and Supportive ? ?Affect:  Flat ? ?Cognitive:  Alert ? ?Insight:  Improving ? ?Engagement in Group:  Engaged ? ?Modes of Intervention:  Clarification, Education, and Support ? ?Summary of Progress/Problems: Positive thinking and positive change were discussed.  ? ?Johann Capers S ?05/19/2021, 9:43 PM ?

## 2021-05-19 NOTE — Plan of Care (Signed)
  Problem: Education: Goal: Emotional status will improve Outcome: Not Progressing Goal: Mental status will improve Outcome: Not Progressing Goal: Verbalization of understanding the information provided will improve Outcome: Not Progressing   

## 2021-05-19 NOTE — Progress Notes (Signed)
? ?  Pt awakened this am very anxious requesting the time.  Pt reports he just finished his devotional and scripture.  Pt presented as bizarre, guarded and brief stating, "I just want to call my wife."  Pt refused any PRNs for anxiety.  Pt denies SI/HI/AVH. And verbally contracts for safety. ? ?Pt is safe on unit with Q 15 minute safety checks. ?

## 2021-05-19 NOTE — Progress Notes (Signed)
Patient appears pleasant and anxious. Patient denies SI/HI/AVH. Patient complied with morning medication with no reported side effects. Pt rates anxiety as a 6/10 and depression a 4/10. Pt reports he is sleeping "better". Pt appeared suspicious of nurse but complied with questions. Patient remains safe on Q2min checks and contracts for safety.  ? ? ? 05/19/21 1000  ?Psych Admission Type (Psych Patients Only)  ?Admission Status Voluntary  ?Psychosocial Assessment  ?Patient Complaints Anxiety;Depression  ?Eye Contact Brief  ?Facial Expression Flat;Anxious  ?Affect Anxious;Depressed  ?Speech Logical/coherent  ?Interaction Assertive  ?Motor Activity Restless  ?Appearance/Hygiene Unremarkable  ?Behavior Characteristics Anxious  ?Mood Depressed;Suspicious  ?Thought Process  ?Coherency WDL  ?Content WDL  ?Delusions WDL  ?Perception WDL  ?Hallucination None reported or observed  ?Judgment Limited  ?Confusion None  ?Danger to Self  ?Current suicidal ideation? Denies  ?Self-Injurious Behavior No self-injurious ideation or behavior indicators observed or expressed   ?Agreement Not to Harm Self Yes  ?Description of Agreement verbal contract  ?Danger to Others  ?Danger to Others None reported or observed  ? ? ?

## 2021-05-19 NOTE — Progress Notes (Signed)
[facility] MD Progress Note ? ?05/19/2021 3:37 PM ?Mark Hanson  ?MRN:  [id number] ? ?Subjective:  Mark Hanson reports: "I am doing much better today than I was yesterday. I have also decided that I want the doctor to change my medications while I am here." ? 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 [facility] for treatment and stabilization of his mood. This is pt's first inpatient behavioral health related admission. ? ?Today's patient assessment: Pt with with a euthymic mood, affect is congruent and appropriate. Attention to personal hygiene and grooming is fair, eye contact is good, speech is clear & coherent. Thought contents are organized and logical, and pt currently denies SI/HI/AVH or paranoia. There is no evidence of delusional thoughts.  Pt reports feeling better today, reports that he has a conversation with the attending Psychiatrist and is agreeing to medication changes. He also reports that "what I was not open with you about yesterday, I was open with the doctor today".  ? ?Pt reports that his sleep quality last night was "very good", and reports a good appetite. 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 Empathy and active listening given. Pt is agreeable to cross tapering his Zoloft to Nortriptyline for management of his depressive symptoms. He is also agreeable to the Vraylar 1.5 mg x 2 doses, then stopping it. Will decrease zoloft to 100 mg once daily, starting tomorrow, and we will start nortriptyline 10 mg once daily tomorrow morning as wellAttending Psychiatrist discussed rationales, benefits and possible side effects of medications discussed with pt. ? ?Principal Problem:  MDD (major depressive disorder), recurrent severe, without psychosis ([place]) ?Diagnosis: Principal Problem: ?  MDD (major depressive disorder), recurrent severe, without psychosis ([place]) ?Active Problems: ?  OCD (obsessive compulsive disorder) ?  GAD (generalized anxiety disorder) ?  MDD (major depressive disorder), recurrent episode, severe ([place]) ? ?Total Time spent with patient: 20 minutes ? ?Past Psychiatric History: As above ? ?Past Medical History:  ?Past Medical History:  ?Diagnosis Date  ? ADD (attention deficit disorder)   ? History reviewed. No pertinent surgical history. ?Family History: History reviewed. No pertinent family history. ?Family Psychiatric  History: great grandfather had depression, mother with depression ?Social History:  ?Social History  ? ?Substance and Sexual Activity  ?Alcohol Use No  ?   ?Social History  ? ?Substance and Sexual Activity  ?Drug Use No  ?  ?Social History  ? ?Socioeconomic History  ? Marital status: Married  ?  Spouse name: Mark Hanson  ? Number of children: 1  ? Years of education: 57  ? Highest education level: Bachelor's degree (e.g., BA, AB, BS)  ?Occupational History  ? Occupation: Data processing manager  ?  Comment: [employer]  ?Tobacco Use  ? Smoking status: Never  ? Smokeless tobacco: Never  ?Vaping Use  ? Vaping Use: Never used  ?Substance and Sexual Activity  ? Alcohol use: No  ? Drug use: No  ? Sexual activity: Yes  ?  Birth control/protection: None  ?Other Topics Concern  ? Not on file  ?Social History Narrative  ? Lives with wife and son in [place].   ? ?Social Determinants  of Health  ? ?Financial Resource Strain: Not on file  ?Food Insecurity: Not on file  ?Transportation Needs: Not on file  ?Physical Activity: Not on file  ?Stress: Not on file  ?Social Connections: Not on file  ? ?Additional Social History:  ?  ? Sleep: Good ? ?Appetite:  Poor ? ?Current Medications: ?Current Facility-Administered Medications  ?Medication Dose Route Frequency Provider Last Rate  Last Admin  ? acetaminophen (TYLENOL) tablet 650 mg  650 mg Oral Q6H PRN Rozetta Nunnery, NP      ? alum & mag hydroxide-simeth (MAALOX/MYLANTA) 200-200-20 MG/5ML suspension 30 mL  30 mL Oral Q4H PRN Rozetta Nunnery, NP      ? cariprazine (VRAYLAR) capsule 1.5 mg  1.5 mg Oral Daily Emmanuella Mirante, NP      ? hydrOXYzine (ATARAX) tablet 25 mg  25 mg Oral TID PRN Rozetta Nunnery, NP      ? magnesium hydroxide (MILK OF MAGNESIA) suspension 30 mL  30 mL Oral Daily PRN Rozetta Nunnery, NP      ? Derrill Memo ON 05/20/2021] nortriptyline (PAMELOR) capsule 10 mg  10 mg Oral QHS Nicholes Rough, NP      ? Derrill Memo ON 05/20/2021] sertraline (ZOLOFT) tablet 100 mg  100 mg Oral Daily Reizel Calzada, NP      ? traZODone (DESYREL) tablet 50 mg  50 mg Oral QHS PRN Massengill, Ovid Curd, MD      ? ? ?Lab Results:  ?Results for orders placed or performed during the hospital encounter of 05/18/21 (from the past 48 hour(s))  ?CBC     Status: None  ? Collection Time: 05/18/21  6:13 AM  ?Result Value Ref Range  ? WBC 7.2 4.0 - 10.5 K/uL  ? RBC 5.49 4.22 - 5.81 MIL/uL  ? Hemoglobin 17.0 13.0 - 17.0 g/dL  ? HCT 49.5 39.0 - 52.0 %  ? MCV 90.2 80.0 - 100.0 fL  ? MCH 31.0 26.0 - 34.0 pg  ? MCHC 34.3 30.0 - 36.0 g/dL  ? RDW 12.7 11.5 - 15.5 %  ? Platelets 327 150 - 400 K/uL  ? nRBC 0.0 0.0 - 0.2 %  ?  Comment: Performed at [facility], [place] [address] [place], [place] [address]  ?Comprehensive metabolic panel     Status: Abnormal  ? Collection Time: 05/18/21  6:13 AM  ?Result Value Ref Range  ? Sodium 137 135 - 145 mmol/L  ? Potassium 4.3 3.5 - 5.1 mmol/L  ? Chloride 106 98 - 111 mmol/L  ? CO2 24 22 - 32 mmol/L  ? Glucose, Bld 105 (H) 70 - 99 mg/dL  ?  Comment: Glucose reference range applies only to samples taken after fasting for at least 8 hours.  ? BUN 18 6 - 20 mg/dL  ? Creatinine, Ser 0.79 0.61 - 1.24 mg/dL  ? Calcium 9.2 8.9 - 10.3 mg/dL  ? Total Protein 7.8 6.5 - 8.1 g/dL  ? Albumin 4.3 3.5 - 5.0 g/dL  ? AST 20 15 - 41 U/L  ? ALT 22 0  - 44 U/L  ? Alkaline Phosphatase 68 38 - 126 U/L  ? Total Bilirubin 0.9 0.3 - 1.2 mg/dL  ? GFR, Estimated >60 >60 mL/min  ?  Comment: (NOTE) ?Calculated using the CKD-EPI Creatinine Equation (2021) ?  ? Anion gap 7 5 - 15  ?  Comment: Performed at [facility], [place] [address], [place],  [address]  ?Hemoglobin A1c  Status: None  ? Collection Time: 05/18/21  6:13 AM  ?Result Value Ref Range  ? Hgb A1c MFr Bld 5.5 4.8 - 5.6 %  ?  Comment: (NOTE) ?Pre diabetes:          5.7%-6.4% ? ?Diabetes:              >6.4% ? ?Glycemic control for   <7.0% ?adults with diabetes ?  ? Mean Plasma Glucose 111.15 mg/dL  ?  Comment: Performed at [facility], [place] [address] [place], [place] [address]  ?Lipid panel     Status: Abnormal  ? Collection Time: 05/18/21  6:13 AM  ?Result Value Ref Range  ? Cholesterol 190 0 - 200 mg/dL  ? Triglycerides 54 <150 mg/dL  ? HDL 61 >40 mg/dL  ? Total CHOL/HDL Ratio 3.1 RATIO  ? VLDL 11 0 - 40 mg/dL  ? LDL Cholesterol 118 (H) 0 - 99 mg/dL  ?  Comment:        ?Total Cholesterol/HDL:CHD Risk ?Coronary Heart Disease Risk Table ?                    Men   Women ? 1/2 Average Risk   3.4   3.3 ? Average Risk       5.0   4.4 ? 2 X Average Risk   9.6   7.1 ? 3 X Average Risk  23.4   11.0 ?       ?Use the calculated Patient Ratio ?above and the CHD Risk Table ?to determine the patient's CHD Risk. ?       ?ATP III CLASSIFICATION (LDL): ? <100     mg/dL   Optimal ? 100-129  mg/dL   Near or Above ?                   Optimal ? 130-159  mg/dL   Borderline ? 160-189  mg/dL   High ? >190     mg/dL   Very High ?Performed at [facility], [place] [address], [place], [place] [address] ?  ?TSH     Status: Abnormal  ? Collection Time: 05/18/21  6:13 AM  ?Result Value Ref Range  ? TSH 4.737 (H) 0.350 - 4.500 uIU/mL  ?  Comment: Performed by a 3rd Generation assay with a functional sensitivity of <=0.01 uIU/mL. ?Performed at [facility], [place] [address] [place], [place] [address] ?  ?Magnesium     Status: None  ? Collection Time: 05/18/21  6:13 AM  ?Result Value Ref Range  ? Magnesium 2.1 1.7 - 2.4 mg/dL  ?  Comment: Performed at [facility]

## 2021-05-19 NOTE — Progress Notes (Signed)
Pt states goal for the day is to work on "overcoming anxiety and fear". Pt remains safe on Q 15 min checks. ?

## 2021-05-20 MED ORDER — SERTRALINE HCL 25 MG PO TABS
25.0000 mg | ORAL_TABLET | Freq: Every day | ORAL | Status: AC
  Administered 2021-05-23: 25 mg via ORAL
  Filled 2021-05-20: qty 1

## 2021-05-20 MED ORDER — NORTRIPTYLINE HCL 10 MG PO CAPS
30.0000 mg | ORAL_CAPSULE | Freq: Every day | ORAL | Status: AC
  Administered 2021-05-22: 30 mg via ORAL
  Filled 2021-05-20: qty 3

## 2021-05-20 MED ORDER — NORTRIPTYLINE HCL 25 MG PO CAPS
50.0000 mg | ORAL_CAPSULE | Freq: Every day | ORAL | Status: DC
  Filled 2021-05-20: qty 2

## 2021-05-20 MED ORDER — SERTRALINE HCL 50 MG PO TABS
50.0000 mg | ORAL_TABLET | Freq: Every day | ORAL | Status: AC
  Administered 2021-05-22: 50 mg via ORAL
  Filled 2021-05-20: qty 1

## 2021-05-20 MED ORDER — NORTRIPTYLINE HCL 10 MG PO CAPS
20.0000 mg | ORAL_CAPSULE | Freq: Every day | ORAL | Status: AC
  Administered 2021-05-21: 20 mg via ORAL
  Filled 2021-05-20: qty 2

## 2021-05-20 MED ORDER — SERTRALINE HCL 50 MG PO TABS
75.0000 mg | ORAL_TABLET | Freq: Every day | ORAL | Status: AC
  Administered 2021-05-21: 75 mg via ORAL
  Filled 2021-05-20: qty 1

## 2021-05-20 MED ORDER — NORTRIPTYLINE HCL 10 MG PO CAPS
40.0000 mg | ORAL_CAPSULE | Freq: Every day | ORAL | Status: AC
  Administered 2021-05-23: 40 mg via ORAL
  Filled 2021-05-20: qty 4

## 2021-05-20 NOTE — Group Note (Signed)
LCSW Group Therapy Note ? ? ?Group Date: 05/20/2021 ?Start Time: 1300 ?End Time: 1400 ? ?Type of Therapy:  Group Therapy: Value Clarification  ?  ?  ?Participation Level: Active ?  ?Description of Group:  ?In this group, patients will learn to explore, identify, and clarify the values that influence their decisions and behavior and encourages them to build on their inner resources and strengths. This group will be clarifying, exporational and eductional, with patients participating in identifying their own values, identifying others values, and identifying societal values and their impact on their lives.  ?  ?  ?Therapeutic Goals: ?Patient will learn to identify values. ?Patient will learn to identify others values. ?Patient will learn ways to use their values in decisions they make in their daily lives. ?Patient will receive support and feedback from others ?  ?  ?Therapeutic Modalities:  ?Acceptance and Commitment Therapy  ?  ?  ?Summary of Progress/Problems: Pt attended and participated in group. Pt shared that he admires his mother who works hard. He states that family and health are values he holds. ? ? ?Ruthann Cancer MSW, LCSW ?Clincal Social Worker  ?[facility]  ?  ?

## 2021-05-20 NOTE — Group Note (Signed)
Recreation Therapy Group Note ? ? ?Group Topic:Stress Management  ?Group Date: 05/20/2021 ?Start Time: [phone] ?End Time: 0945 ?Facilitators: Caroll Rancher, LRT,CTRS ?Location: [address] Day[address] ? ? ?Goal Area(s) Addresses:  ?Patient will identify positive stress management techniques. ?Patient will identify benefits of using stress management post d/c. ? ?Group Description:  Meditation.  LRT played a meditation that focused on visualizing your future self and the life you wish to live.  The meditation also had listeners identify the inner roadblocks that were holding them back from progressing in life.  Patients were to learn to unlock the roadblocks, recognize them and learn to overcome them to be the best versions of themselves. ? ? ?Affect/Mood: Appropriate ?  ?Participation Level: Active ?  ?Participation Quality: Independent ?  ?Behavior: Appropriate ?  ?Speech/Thought Process: Focused ?  ?Insight: Good ?  ?Judgement: Good ?  ?Modes of Intervention: Meditation ?  ?Patient Response to Interventions:  Attentive ?  ?Education Outcome: ? Acknowledges education and In group clarification offered   ? ?Clinical Observations/Individualized Feedback: Pt attended and participated in group session.  ?  ? ?Plan: Continue to engage patient in RT group sessions 2-3x/week. ? ? ?Caroll Rancher, LRT,CTRS  ?05/20/2021 1:05 PM ?

## 2021-05-20 NOTE — BHH Group Notes (Signed)
Adult Psychoeducational Group Note ? ?Date:  05/20/2021 ?Time:  10:20 AM ? ?Group Topic/Focus:  ?Orientation:   The focus of this group is to educate the patient on the purpose and policies of crisis stabilization and provide a format to answer questions about their admission.  The group details unit policies and expectations of patients while admitted. ? ?Participation Level:  Active ? ?Participation Quality:  Appropriate ? ?Affect:  Appropriate ? ?Cognitive:  Appropriate ? ?Insight: Appropriate ? ?Engagement in Group:  Engaged ? ?Modes of Intervention:  Education ? ? ?Glenna Durand ?05/20/2021, 10:20 AM ?

## 2021-05-20 NOTE — Progress Notes (Signed)
[facility] MD Progress Note ? ?05/20/2021 1:55 PM ?[name]  ?MRN:  [id number] ? 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 Pt was transferred to this [facility] for treatment and stabilization of his mood. This is pt's first inpatient behavioral health related admission. ? ?Yesterday, the following recommendations were made: ?-Decrease Zoloft to 100 mg daily for MDD starting 5/5 ?-Start Pamelor 10 mg daily starting 5/5 for MDD ?-Continue Vraylar 1.5 mg daily for Mood stabilization x 2 doses and stop ? ?On assessment today, the patient denies having any side effects to starting the cross taper from Zoloft to nortriptyline. ?He reports his mood is less depressed.  Patient denies having intrusive thoughts (patient has historically minimized having these).  He reports that sleep is okay, appetite is okay. ?Patient continues to have some difficulty with concentration. ?Patient denies having any suicidal thoughts or any intrusive thoughts to harm himself.  Denies HI. ?Patient agreeable with continued cross taper Zoloft to nortriptyline.  We discussed risk of serotonin syndrome, and symptoms to be aware of and let nursing staff know about, if he experiences including confusion, sweating, flushing, tachycardia or chest pain, or GI upset.  ? ?Principal Problem: MDD (major depressive disorder), recurrent severe, without psychosis (HCC) ?Diagnosis: Principal Problem: ?  MDD (major depressive disorder), recurrent severe, without psychosis (HCC) ?Active Problems: ?  OCD (obsessive compulsive disorder) ?  GAD (generalized anxiety disorder) ?  MDD (major depressive disorder), recurrent episode, severe (HCC) ? ?Total Time spent with patient: 20 minutes ? ?Past Psychiatric History: MDD, social anxiety, ADHD ? ? ?Past Medical  History:  ?Past Medical History:  ?Diagnosis Date  ? ADD (attention deficit disorder)   ? History reviewed. No pertinent surgical history. ?Family History: History reviewed. No pertinent family history. ? ?Family Psychiatric  History: MDD in mother and grandfather ? ?Social History:  ?Social History  ? ?Substance and Sexual Activity  ?Alcohol Use No  ?   ?Social History  ? ?Substance and Sexual Activity  ?Drug Use No  ?  ?Social History  ? ?Socioeconomic History  ? Marital status: Married  ?  Spouse name: [name]  ? Number of children: 1  ? Years of education: 7  ? Highest education level: Bachelor's degree (e.g., BA, AB, BS)  ?Occupational History  ? Occupation: Barista  ?  Comment: [employer]  ?Tobacco Use  ? Smoking status: Never  ? Smokeless tobacco: Never  ?Vaping Use  ? Vaping Use: Never used  ?Substance and Sexual Activity  ? Alcohol use: No  ? Drug use: No  ? Sexual activity: Yes  ?  Birth control/protection: None  ?Other Topics Concern  ? Not on file  ?Social History Narrative  ? Lives with wife and son in [place].   ? ?Social Determinants of Health  ? ?Financial Resource Strain: Not on file  ?Food Insecurity: Not on file  ?Transportation Needs: Not on file  ?Physical Activity: Not on file  ?Stress: Not on file  ?Social Connections: Not on file  ? ?Additional Social History:  ?  ?  ?  ?  ?  ?  ?  ?  ?  ?  ?  ? ?Sleep: Good ? ?Appetite:  Good ? ?Current Medications: ?Current Facility-Administered Medications  ?Medication  Dose Route Frequency Provider Last Rate Last Admin  ? acetaminophen (TYLENOL) tablet 650 mg  650 mg Oral Q6H PRN Jackelyn PolingBerry, Jason A, NP      ? alum & mag hydroxide-simeth (MAALOX/MYLANTA) 200-200-20 MG/5ML suspension 30 mL  30 mL Oral Q4H PRN Jackelyn PolingBerry, Jason A, NP      ? cariprazine (VRAYLAR) capsule 1.5 mg  1.5 mg Oral Daily Nkwenti, Doris, NP   1.5 mg at 05/19/21 1704  ? hydrOXYzine (ATARAX) tablet 25 mg  25 mg Oral TID PRN Jackelyn PolingBerry, Jason A, NP      ? magnesium hydroxide (MILK OF  MAGNESIA) suspension 30 mL  30 mL Oral Daily PRN Jackelyn PolingBerry, Jason A, NP      ? Melene Muller[START ON 05/21/2021] nortriptyline (PAMELOR) capsule 20 mg  20 mg Oral Daily Jaeliana Lococo, MD      ? Followed by  ? Melene Muller[START ON 05/22/2021] nortriptyline (PAMELOR) capsule 30 mg  30 mg Oral Daily Verena Shawgo, MD      ? Followed by  ? Melene Muller[START ON 05/23/2021] nortriptyline (PAMELOR) capsule 40 mg  40 mg Oral Daily Hephzibah Strehle, Harrold DonathNathan, MD      ? Followed by  ? Melene Muller[START ON 05/24/2021] nortriptyline (PAMELOR) capsule 50 mg  50 mg Oral Daily Tykee Heideman, Harrold DonathNathan, MD      ? Melene Muller[START ON 05/21/2021] sertraline (ZOLOFT) tablet 75 mg  75 mg Oral Daily Tequita Marrs, Harrold DonathNathan, MD      ? Followed by  ? Melene Muller[START ON 05/22/2021] sertraline (ZOLOFT) tablet 50 mg  50 mg Oral Daily Ameen Mostafa, MD      ? Followed by  ? Melene Muller[START ON 05/23/2021] sertraline (ZOLOFT) tablet 25 mg  25 mg Oral Daily Latanja Lehenbauer, MD      ? traZODone (DESYREL) tablet 50 mg  50 mg Oral QHS PRN Lillie Bollig, Harrold DonathNathan, MD      ? ? ?Lab Results:  ?Results for orders placed or performed during the hospital encounter of 05/18/21 (from the past 48 hour(s))  ?VITAMIN D 25 Hydroxy (Vit-D Deficiency, Fractures)     Status: None  ? Collection Time: 05/18/21  6:30 PM  ?Result Value Ref Range  ? Vit D, 25-Hydroxy 30.47 30 - 100 ng/mL  ?  Comment: (NOTE) ?Vitamin D deficiency has been defined by the Institute of Medicine  ?and an Endocrine Society practice guideline as a level of serum 25-OH  ?vitamin D less than 20 ng/mL (1,2). The Endocrine Society went on to  ?further define vitamin D insufficiency as a level between 21 and 29  ?ng/mL (2). ? ?1. IOM [employer](Institute of Medicine). 2010. Dietary reference intakes for  ?calcium and D. [place]: The [employer] Academies Press. ?2. Holick MF, Binkley [place], Bischoff-Ferrari HA, et al. Evaluation,  ?treatment, and prevention of vitamin D deficiency: an Endocrine  ?Society clinical practice guideline, JCEM. 2011 Jul; 96(7): 1911-30. ? ?Performed at [facility], [address] [place], [place] ?[id number] ?  ?Comprehensive metabolic panel     Status: Abnormal  ? Collection Time: 05/18/21  6:30 PM  ?Result Value Ref Range  ? Sodium 138 135 - 145 mmol/L  ? Potassium 3.8 3.5 - 5.1 mmol/L  ? Chloride 106 98 - 111 mmol/L  ? CO2 24 22 - 32 mmol/L  ? Glucose, Bld 154 (H) 70 - 99 mg/dL  ?  Comment: Glucose reference range applies only to samples taken after fasting for at least 8 hours.  ? BUN 23 (H) 6 - 20 mg/dL  ? Creatinine, Ser  0.82 0.61 - 1.24 mg/dL  ? Calcium 9.3 8.9 - 10.3 mg/dL  ? Total Protein 7.9 6.5 - 8.1 g/dL  ? Albumin 4.1 3.5 - 5.0 g/dL  ? AST 21 15 - 41 U/L  ? ALT 22 0 - 44 U/L  ? Alkaline Phosphatase 70 38 - 126 U/L  ? Total Bilirubin 0.5 0.3 - 1.2 mg/dL  ? GFR, Estimated >60 >60 mL/min  ?  Comment: (NOTE) ?Calculated using the CKD-EPI Creatinine Equation (2021) ?  ? Anion gap 8 5 - 15  ?  Comment: Performed at [facility], [address] [place], [place] [address]  ?T4, free     Status: None  ? Collection Time: 05/18/21  6:30 PM  ?Result Value Ref Range  ? Free T4 0.77 0.61 - 1.12 ng/dL  ?  Comment: (NOTE) ?Biotin ingestion may interfere with free T4 tests. If the results are ?inconsistent with the TSH level, previous test results, or the ?clinical presentation, then consider biotin interference. If needed, ?order repeat testing after stopping biotin. ?Performed at [facility], [address] [place], [place] ?[address] ?  ? ? ?Blood Alcohol level:  ?No results found for: [facility] ? ?Metabolic Disorder Labs: ?Lab Results  ?Component Value Date  ? HGBA1C 5.5 05/18/2021  ? MPG 111.15 05/18/2021  ? ?No results found for: PROLACTIN ?Lab Results  ?Component Value Date  ? CHOL 190 05/18/2021  ? TRIG 54 05/18/2021  ? HDL 61 05/18/2021  ? CHOLHDL 3.1 05/18/2021  ? VLDL 11 05/18/2021  ? LDLCALC 118 (H) 05/18/2021  ? ? ?Physical Findings: ?AIMS:  , ,  ,  ,    ?CIWA:    ?COWS:    ? ?Musculoskeletal: ?Strength & Muscle Tone: within normal limits ?Gait &  Station: normal ?Patient leans: N/A ? ?Psychiatric Specialty Exam: ? ?Presentation  ?General Appearance: Appropriate for Environment ? ?Eye Contact:Good ? ?Speech:Clear and Coherent ? ?Speech Volume:Normal ? ?H

## 2021-05-20 NOTE — Progress Notes (Addendum)
D: Patient states he is eating and sleeping well. He rates his depressive symptoms as a 1 and his anxiety as a 3/4. He denies any thoughts of self harm. Patient had questions regarding his medication this am and seemed satisfied with the answers. His goal today is "being happy and laughing."  ? ?A: Continue to monitor medication management and MD orders.  Safety checks completed every 15 minutes per protocol.  Offer support and encouragement as needed. ? ?R: Patient is receptive to staff; his behavior is appropriate.   ? ? 05/20/21 0900  ?Psych Admission Type (Psych Patients Only)  ?Admission Status Voluntary  ?Psychosocial Assessment  ?Patient Complaints Anxiety  ?Eye Contact Brief  ?Facial Expression Flat;Anxious  ?Affect Anxious  ?Speech Logical/coherent  ?Interaction Assertive  ?Motor Activity Fidgety  ?Appearance/Hygiene Unremarkable  ?Behavior Characteristics Anxious  ?Mood Pleasant  ?Thought Process  ?Coherency WDL  ?Content WDL  ?Delusions None reported or observed  ?Perception WDL  ?Hallucination None reported or observed  ?Judgment Limited  ?Confusion None  ?Danger to Self  ?Current suicidal ideation? Denies  ?Self-Injurious Behavior No self-injurious ideation or behavior indicators observed or expressed   ?Agreement Not to Harm Self Yes  ?Description of Agreement verbal  ?Danger to Others  ?Danger to Others None reported or observed  ? ? ?

## 2021-05-20 NOTE — Progress Notes (Signed)
Adult Psychoeducational Group Note ? ?Date:  05/20/2021 ?Time:  8:32 PM ? ?Group Topic/Focus:  ?Wrap-Up Group:   The focus of this group is to help patients review their daily goal of treatment and discuss progress on daily workbooks. ? ?Participation Level:  Active ? ?Participation Quality:  Appropriate ? ?Affect:  Appropriate ? ?Cognitive:  Appropriate ? ?Insight: Appropriate ? ?Engagement in Group:  Engaged ? ?Modes of Intervention:  Discussion ? ?Additional Comments:  Pt attend AA group without incident. ? ?Felipa Furnace ?05/20/2021, 8:32 PM ?

## 2021-05-20 NOTE — BHH Group Notes (Signed)
Adult Psychoeducational Group Note ? ?Date:  05/20/2021 ?Time:  2:59 PM ? ?Group Topic/Focus:  ?Wellness Toolbox:   The focus of this group is to discuss various aspects of wellness, balancing those aspects and exploring ways to increase the ability to experience wellness.  Patients will create a wellness toolbox for use upon discharge. ? ?Participation Level:  Active ? ?Participation Quality:  Appropriate ? ?Affect:  Appropriate ? ?Cognitive:  Alert ? ?Insight: Appropriate ? ?Engagement in Group:  Engaged ? ?Modes of Intervention:  Activity ? ?Additional Comments:  Patient attended and participated in the relaxation group activity. ? ?Jearl Klinefelter ?05/20/2021, 2:59 PM ?

## 2021-05-20 NOTE — Progress Notes (Signed)
?   05/20/21 0400  ?Psych Admission Type (Psych Patients Only)  ?Admission Status Voluntary  ?Psychosocial Assessment  ?Patient Complaints Anxiety;Depression  ?Eye Contact Brief  ?Facial Expression Flat;Anxious  ?Affect Anxious;Depressed  ?Speech Logical/coherent  ?Interaction Assertive  ?Motor Activity Restless  ?Appearance/Hygiene Unremarkable  ?Behavior Characteristics Anxious  ?Mood Depressed;Suspicious  ?Thought Process  ?Coherency WDL  ?Content WDL  ?Delusions WDL  ?Perception WDL  ?Hallucination None reported or observed  ?Judgment Limited  ?Confusion None  ?Danger to Self  ?Current suicidal ideation? Denies  ?Self-Injurious Behavior No self-injurious ideation or behavior indicators observed or expressed   ?Agreement Not to Harm Self Yes  ?Description of Agreement verbal contract  ?Danger to Others  ?Danger to Others None reported or observed  ? ? ?

## 2021-05-21 NOTE — BHH Group Notes (Signed)
?  Goals Group ?05/21/2021 ? ? ?Group Focus: affirmation, clarity of thought, and goals/reality orientation ?Treatment Modality:  Psychoeducation ?Interventions utilized were assignment, group exercise, and support ?Purpose: To be able to understand and verbalize the reason for their admission to the hospital. To understand that the medication helps with their chemical imbalance but they also need to work on their choices in life. To be challenged to develop Hanson list of 30 positives about themselves. Also introduce the concept that "feelings" are not reality. ? ?Participation Level:  Active ? ?Participation Quality:  Appropriate ? ?Affect:  Appropriate ? ?Cognitive:  Appropriate ? ?Insight:  Improving ? ?Engagement in Group:  Engaged ? ?Additional Comments:  Rates his energy Hanson 7/10. Participated in the group. ? ?Mark Hanson ?

## 2021-05-21 NOTE — Progress Notes (Signed)
[facility] ? ?05/21/2021 10:36 AM ?Mark KelchPhilip Hanson  ?MRN:  [id number] ? ?Subjective: "I am doing good. I got to see my wife last night, and she told me she's coming again today." ? 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 [facility] and stabilization of his mood. This is pt's first inpatient behavioral health related admission. ? ?Today's patient Assessment: Pt presents today with a euthymic mood & affect is congruent and appropriate. Pt's attention to personal hygiene and grooming is fair, eye contact is good, speech is clear & coherent. Thought contents are organized and logical, and pt currently denies SI/HI/AVH or paranoia. There is no evidence of delusional thoughts.   ? ?Pt is visible on the milieu, attending group sessions, and is interacting appropriately with peers. He reports that his wife came to see him last night, which made his night good. As per nursing documentation, he was been active and engaged in group sessions, and is taking his medications as scheduled. He reports a good sleep quality last night, and reports a good appetite. He also denies any medication related side effects, and states that he is looking forward to be discharged on Monday. Cross taper from Zoloft to MotorolaPamelor is ongoing for management of pt's depressive symptoms. Pt is also on Vraylar 1.5 mg as an adjunct med for mood stabilization. Will continue all meds as listed below. ? ?Principal Problem: MDD (major depressive disorder), recurrent severe, without psychosis (HCC) ?Diagnosis: Principal Problem: ?  MDD (major depressive disorder), recurrent severe, without psychosis (HCC) ?Active Problems: ?  OCD (obsessive compulsive disorder) ?  GAD (generalized anxiety disorder) ?   MDD (major depressive disorder), recurrent episode, severe (HCC) ? ?Total Time spent with patient: 20 minutes ? ?Past Psychiatric History: MDD, social anxiety, ADHD ? ? ?Past Medical History:  ?Past Medical History:  ?Diagnosis Date  ? ADD (attention deficit disorder)   ? History reviewed. No pertinent surgical history. ?Family History: History reviewed. No pertinent family history. ? ?Family Psychiatric  History: MDD in mother and grandfather ? ?Social History:  ?Social History  ? ?Substance and Sexual Activity  ?Alcohol Use No  ?   ?Social History  ? ?Substance and Sexual Activity  ?Drug Use No  ?  ?Social History  ? ?Socioeconomic History  ? Marital status: Married  ?  Spouse name: Mark ConesLaurie  ? Number of children: 1  ? Years of education: 6812  ? Highest education level: Bachelor's degree (e.g., BA, AB, BS)  ?Occupational History  ? Occupation: BaristaWaste Collection Thrower  ?  Comment: [employer]  ?Tobacco Use  ? Smoking status: Never  ? Smokeless tobacco: Never  ?Vaping Use  ? Vaping Use: Never used  ?Substance and Sexual Activity  ? Alcohol use: No  ? Drug use: No  ? Sexual activity: Yes  ?  Birth control/protection: None  ?Other Topics Concern  ? Not on file  ?Social History Narrative  ? Lives [place] and son in [place]   ? ?Social Determinants of Health  ? ?Financial Resource Strain: Not on file  ?Food Insecurity: Not on file  ?Transportation Needs: Not on file  ?Physical Activity: Not on file  ?Stress: Not on file  ?Social Connections: Not on  file  ? ?Additional Social History:  ?  ?Sleep: Good ? ?Appetite:  Good ? ?Current Medications: ?Current Facility-Administered Medications  ?Medication Dose Route Frequency Provider Last Rate Last Admin  ? acetaminophen (TYLENOL) tablet 650 mg  650 mg Oral Q6H PRN Mark Poling, NP      ? alum & mag hydroxide-simeth (MAALOX/MYLANTA) 200-200-20 MG/5ML suspension 30 mL  30 mL Oral Q4H PRN Mark Poling, NP      ? hydrOXYzine (ATARAX) tablet 25 mg  25 mg Oral TID PRN Mark Poling, NP      ? magnesium hydroxide (MILK OF MAGNESIA) suspension 30 mL  30 mL Oral Daily PRN Mark Poling, NP      ? Melene Muller ON 05/22/2021] nortriptyline (PAMELOR) capsule 30 mg  30 mg Oral Daily Hanson, Mark Donath, MD      ? Followed by  ? Melene Muller ON 05/23/2021] nortriptyline (PAMELOR) capsule 40 mg  40 mg Oral Daily Hanson, Nathan, MD      ? Followed by  ? Melene Muller ON 05/24/2021] nortriptyline (PAMELOR) capsule 50 mg  50 mg Oral Daily Hanson, Mark Donath, MD      ? Melene Muller ON 05/22/2021] sertraline (ZOLOFT) tablet 50 mg  50 mg Oral Daily Hanson, Nathan, MD      ? Followed by  ? Melene Muller ON 05/23/2021] sertraline (ZOLOFT) tablet 25 mg  25 mg Oral Daily Hanson, Nathan, MD      ? traZODone (DESYREL) tablet 50 mg  50 mg Oral QHS PRN Mark Inches, MD      ? ? ?Lab Results:  ?No results found for this or any previous visit (from the past 48 hour(s)). ? ? ?Blood Alcohol level:  ?No results found for: [facility] ? ?Metabolic Disorder Labs: ?Lab Results  ?Component Value Date  ? HGBA1C 5.5 05/18/2021  ? MPG 111.15 05/18/2021  ? ?No results found for: PROLACTIN ?Lab Results  ?Component Value Date  ? CHOL 190 05/18/2021  ? TRIG 54 05/18/2021  ? HDL 61 05/18/2021  ? CHOLHDL 3.1 05/18/2021  ? VLDL 11 05/18/2021  ? LDLCALC 118 (H) 05/18/2021  ? ? ?Physical Findings: ?AIMS: 0 ?CIWA:   n/a ?COWS:  n/a  ? ?Musculoskeletal: ?Strength & Muscle Tone: within normal limits ?Gait & Station: normal ?Patient leans: N/A ? ?Psychiatric Specialty Exam: ? ?Presentation  ?General Appearance: Appropriate for Environment; Fairly Groomed ? ?Eye Contact:Good ? ?Speech:Clear and Coherent ? ?Speech Volume:Normal ? ?Handedness:Right ? ?Mood and Affect  ?Mood:Euthymic ? ?Affect:Appropriate; Congruent ? ?Thought Process  ?Thought Processes:Coherent ? ?Descriptions of Associations:Intact ? ?Orientation:Full (Time, Place and Person) ? ?Thought Content:Logical ? ?History of Schizophrenia/Schizoaffective disorder:No ? ?Duration of Psychotic Symptoms:No data  recorded ?Hallucinations:Hallucinations: None ? ? ?Ideas of Reference:None ? ?Suicidal Thoughts:No data recorded ? ?Homicidal Thoughts:Homicidal Thoughts: No ? ? ?Sensorium  ?Memory:Immediate Good; Recent Good; Remote Good ? ?Judgment:Fair ? ?Insight:Good ? ?Executive Functions  ?Concentration:Good ? ?Attention Span:Good ? ?Recall:Good ? ?Fund of Knowledge:Good ? ?Language:Good ? ?Psychomotor Activity  ?Psychomotor Activity:Psychomotor Activity: Normal ? ? ?Assets  ?Assets:Communication Skills; Social Support; Housing ? ?Sleep  ?Sleep:Sleep: Fair ? ? ?Physical Exam: ?Physical Exam ?Vitals reviewed.  ?HENT:  ?   Head: Normocephalic.  ?   Nose: Nose normal. No congestion or rhinorrhea.  ?Eyes:  ?   Pupils: Pupils are equal, round, and reactive to light.  ?Pulmonary:  ?   Effort: Pulmonary effort is normal.  ?Neurological:  ?   Mental Status: He is oriented to person, place, and time.  ?  Sensory: No sensory deficit.  ?   Motor: No weakness.  ?   Coordination: Coordination normal.  ?   Gait: Gait normal.  ?Psychiatric:     ?   Behavior: Behavior normal.     ?   Thought Content: Thought content normal.  ? ?Review of Systems  ?Constitutional: Negative.   ?HENT: Negative.    ?Eyes: Negative.   ?Respiratory: Negative.    ?Cardiovascular: Negative.   ?Gastrointestinal: Negative.   ?Genitourinary: Negative.   ?Musculoskeletal: Negative.   ?Skin: Negative.   ?Psychiatric/Behavioral:  Positive for depression. Negative for hallucinations, substance abuse and suicidal ideas. The patient is not nervous/anxious and does not have insomnia.   ?Blood pressure (!) 118/91, pulse 81, temperature (!) 97.4 ?F (36.3 ?C), temperature source Oral, resp. rate 20, height 5\' 10"  (1.778 m), weight 81.6 kg, SpO2 98 %. Body mass index is 25.83 kg/m?. ? ?Treatment Plan Summary: ?Daily contact with patient to assess and evaluate symptoms and progress in treatment and Medication management ?   ?  ?PLAN ?Safety and Monitoring: ?Voluntary admission to  inpatient psychiatric unit for safety, stabilization and treatment ?Daily contact with patient to assess and evaluate symptoms and progress in treatment ?Patient's case to be discussed in multi-discipli

## 2021-05-21 NOTE — BHH Group Notes (Signed)
BHH Group Notes:  (Nursing/MHT/Case Management/Adjunct) ? ?Date:  05/21/2021  ?Time:  8:58 PM ? ?Type of Therapy:   Wrap up ? ?Participation Level:  Active ? ?Participation Quality:  Appropriate and Attentive ? ?Affect:  Appropriate ? ?Cognitive:  Appropriate ? ?Insight:  Appropriate, Good, and Improving ? ?Engagement in Group:  Developing/Improving ? ?Modes of Intervention:  Discussion ? ?Summary of Progress/Problems:  ? ?Mark Hanson ?05/21/2021, 8:58 PM ?

## 2021-05-21 NOTE — Progress Notes (Signed)

## 2021-05-21 NOTE — [facility] Counselor (Signed)
.  Psychoeducational Group Note ? ? ? ?Date:  5/6//23 ?Time: 1300-1400 ? ? ? ?Purpose of Group: . The group focus' on teaching patients on how to identify their needs and their Life Skills:  H[facility] group where two lists are made. What people need and what are things that we do that are unhealthy. The lists are developed by the patients and it is explained that we often do the actions that are not healthy to get our list of needs met. ? ?Goal:: to develop the coping skills needed to get their needs met ? ?Participation Level:  Active ? ?Participation Quality:  Appropriate ? ?Affect:  Appropriate ? ?Cognitive:  Oriented ? ?Insight:  Improving ? ?Engagement in Group:  Engaged ? ?Additional Comments: Pt participated fully in the group. Rates his energy at [facility]on 10/10 ? ?Mark [facility]on ? ?

## 2021-05-21 NOTE — Progress Notes (Signed)
D. Pt presents with an improved mood- has been calm and cooperative- no behavioral issues noted. Pt has been visible on the unit observed attending groups. Pt currently denies SI/HI and AVH  ?A. Labs and vitals monitored. Pt given and educated on medications. Pt supported emotionally and encouraged to express concerns and ask questions.   ?R. Pt remains safe with 15 minute checks. Will continue POC. ? ?  ?

## 2021-05-21 NOTE — Progress Notes (Signed)
Adult Psychoeducational Group Note ?  ?Date:  05/21/21 ?Time:  9:52AM ?  ?Group Topic/Focus:  ?Wellness toolbox:   The focus of this group is to help patients identify their emotions, triggers, and healthy ways of feeling thoughts and emotions.  ?  ?Participation Level:  Active ?  ?Participation Quality:  Appropriate ?  ?Affect:  Appropriate ?  ?Cognitive:  Appropriate ?  ?Insight: Appropriate ?  ?Engagement in Group:  Engaged ?  ?Modes of Intervention:  Discussion ?  ?Additional Comments:  Pt attended group without incident. ?  ?Tameika Heckmann , RN  ?05/21/21 9:52AM ?

## 2021-05-21 NOTE — Group Note (Signed)
LCSW Group Therapy Note ? ?Group Date: 05/21/2021 ?Start Time: 1000 ?End Time: 1100 ? ? ?Type of Therapy and Topic:  Group Therapy: Anger Cues and Responses ? ?Participation Level:  Active ? ? ?Description of Group:   ?In this group, patients learned how to recognize the physical, cognitive, emotional, and behavioral responses they have to anger-provoking situations.  They identified a recent time they became angry and how they reacted.  They analyzed how their reaction was possibly beneficial and how it was possibly unhelpful.  The group discussed a variety of healthier coping skills that could help with such a situation in the future.  Focus was placed on how helpful it is to recognize the underlying emotions to our anger, because working on those can lead to a more permanent solution as well as our ability to focus on the important rather than the urgent. ? ?Therapeutic Goals: ?Patients will remember their last incident of anger and how they felt emotionally and physically, what their thoughts were at the time, and how they behaved. ?Patients will identify how their behavior at that time worked for them, as well as how it worked against them. ?Patients will explore possible new behaviors to use in future anger situations. ?Patients will learn that anger itself is normal and cannot be eliminated, and that healthier reactions can assist with resolving conflict rather than worsening situations. ? ?Summary of Patient Progress:  Mark Hanson was active during the group. He shared that once he has learned how to do something, if somebody comes along and changes it, this leads to anger. He was very thoughtful about his remarks and demonstrated good insight into the subject matter, was respectful of peers, and participated throughout the entire session. ? ?Therapeutic Modalities:   ?Cognitive Behavioral Therapy ? ? ? ?Lynnell Chad, LCSWA ?05/21/2021  2:14 PM   ? ?

## 2021-05-22 LAB — VITAMIN B1: Vitamin B1 (Thiamine): 140.6 nmol/L (ref 66.5–200.0)

## 2021-05-22 NOTE — [facility] Group Notes (Signed)
Adult Psychoeducational Group Not ?Date:  05/22/2021 ?Time:  7517-0017 ?Group Topic/Focus: PROGRESSIVE RELAXATION. A group where deep breathing is taught and tensing and relaxation muscle groups is used. Imagery is used as well.  Pts are asked to imagine 3 pillars that hold them up when they are not able to hold themselves up and to share that with the group. ? ?Participation Level:  Active ? ?Participation Quality:  Appropriate ? ?Affect:  Appropriate ? ?Cognitive:  Oriented ? ?Insight: Improving ? ?Engagement in Group:  Engaged ? ?Modes of Intervention:  Activity, Discussion, Education, and Support ? ?Additional Comments:  Rates energy at a 8-9/10. States his wife, mom and his church hold him up. ? ?Vira Blanco A ?

## 2021-05-22 NOTE — Progress Notes (Signed)
?   05/21/21 2030  ?Psych Admission Type (Psych Patients Only)  ?Admission Status Voluntary  ?Psychosocial Assessment  ?Patient Complaints None  ?Eye Contact Fair  ?Facial Expression Flat  ?Affect Appropriate to circumstance  ?Speech Logical/coherent  ?Interaction Minimal  ?Motor Activity Slow  ?Appearance/Hygiene Other (Comment) ?(pt lying in bed awake)  ?Behavior Characteristics Appropriate to situation  ?Mood Pleasant  ?Thought Process  ?Coherency WDL  ?Content WDL  ?Delusions None reported or observed  ?Perception WDL  ?Hallucination None reported or observed  ?Judgment Poor  ?Confusion None  ?Danger to Self  ?Current suicidal ideation?  ?(denies)  ?Agreement Not to Harm Self Yes  ?Description of Agreement verbal consent  ? ? ?

## 2021-05-22 NOTE — Progress Notes (Signed)
[facility] MD Progress Note ? ?05/22/2021 12:41 PM ?Mark Hanson  ?MRN:  [id number] ? ?Subjective: "I am doing really good. The groups have been very helpful, but I am ready to leave tomorrow." ? 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 Pt was transferred to this [facility] for treatment and stabilization of his mood. This is pt's first inpatient behavioral health related admission. ? ?Today's patient Assessment: Pt presents with a euthymic mood & affect is congruent and appropriate. Attention to personal hygiene and grooming is fair, eye contact is good, speech is clear & coherent. Thought contents are organized and logical, and pt currently denies SI/HI/AVH or paranoia. There is no evidence of delusional thoughts.   ? ?Pt is continuing to be visible on the milieu, attending group sessions, and is interacting appropriately with peers. He reports that the group sessions have been very helpful, but states that he will be ready for discharge tomorrow morning. He is reporting a good sleep quality, reports a good appetite, and is tolerating his medications well. Vraylar taper is complete, pt will take last dose of Zoloft 25 mg tomorrow (5/8), and Pamelor is currently at 30 mg, and will increase to 40 mg tomorrow. Will continue this medications along with those listed below. Pt denies all side effects to medications. ? ?Principal Problem: MDD (major depressive disorder), recurrent severe, without psychosis (HCC) ?Diagnosis: Principal Problem: ?  MDD (major depressive disorder), recurrent severe, without psychosis (HCC) ?Active Problems: ?  OCD (obsessive compulsive disorder) ?  GAD (generalized anxiety disorder) ?  MDD (major depressive disorder), recurrent episode, severe (HCC) ? ?Total Time spent with patient: 20  minutes ? ?Past Psychiatric History: MDD, social anxiety, ADHD ? ?Past Medical History:  ?Past Medical History:  ?Diagnosis Date  ? ADD (attention deficit disorder)   ? History reviewed. No pertinent surgical history. ?Family History: History reviewed. No pertinent family history. ? ?Family Psychiatric  History: MDD in mother and grandfather ? ?Social History:  ?Social History  ? ?Substance and Sexual Activity  ?Alcohol Use No  ?   ?Social History  ? ?Substance and Sexual Activity  ?Drug Use No  ?  ?Social History  ? ?Socioeconomic History  ? Marital status: Married  ?  Spouse name: Jacki Hanson  ? Number of children: 1  ? Years of education: 20  ? Highest education level: Bachelor's degree (e.g., BA, AB, BS)  ?Occupational History  ? Occupation: Barista  ?  Comment: [employer]  ?Tobacco Use  ? Smoking status: Never  ? Smokeless tobacco: Never  ?Vaping Use  ? Vaping Use: Never used  ?Substance and Sexual Activity  ? Alcohol use: No  ? Drug use: No  ? Sexual activity: Yes  ?  Birth control/protection: None  ?Other Topics Concern  ? Not on file  ?Social History Narrative  ? Lives with wife and son in [place].   ? ?Social Determinants of Health  ? ?Financial Resource Strain: Not on file  ?Food Insecurity: Not on file  ?Transportation Needs: Not on file  ?Physical Activity: Not on file  ?Stress: Not on file  ?Social Connections: Not on file  ? ?Additional Social History:  ?  ?Sleep: Good ? ?Appetite:  Good ? ?Current Medications: ?Current Facility-Administered Medications  ?Medication  Dose Route Frequency Provider Last Rate Last Admin  ? acetaminophen (TYLENOL) tablet 650 mg  650 mg Oral Q6H PRN Jackelyn Poling, NP      ? alum & mag hydroxide-simeth (MAALOX/MYLANTA) 200-200-20 MG/5ML suspension 30 mL  30 mL Oral Q4H PRN Jackelyn Poling, NP      ? hydrOXYzine (ATARAX) tablet 25 mg  25 mg Oral TID PRN Jackelyn Poling, NP      ? magnesium hydroxide (MILK OF MAGNESIA) suspension 30 mL  30 mL Oral Daily PRN Jackelyn Poling, NP      ? Melene Muller ON 05/23/2021] nortriptyline (PAMELOR) capsule 40 mg  40 mg Oral Daily Massengill, Nathan, MD      ? Followed by  ? Melene Muller ON 05/24/2021] nortriptyline (PAMELOR) capsule 50 mg  50 mg Oral Daily Massengill, Harrold Donath, MD      ? Melene Muller ON 05/23/2021] sertraline (ZOLOFT) tablet 25 mg  25 mg Oral Daily Massengill, Nathan, MD      ? traZODone (DESYREL) tablet 50 mg  50 mg Oral QHS PRN Phineas Inches, MD      ? ? ?Lab Results:  ?No results found for this or any previous visit (from the past 48 hour(s)). ? ? ?Blood Alcohol level:  ?No results found for: [facility] ? ?Metabolic Disorder Labs: ?Lab Results  ?Component Value Date  ? HGBA1C 5.5 05/18/2021  ? MPG 111.15 05/18/2021  ? ?No results found for: PROLACTIN ?Lab Results  ?Component Value Date  ? CHOL 190 05/18/2021  ? TRIG 54 05/18/2021  ? HDL 61 05/18/2021  ? CHOLHDL 3.1 05/18/2021  ? VLDL 11 05/18/2021  ? LDLCALC 118 (H) 05/18/2021  ? ? ?Physical Findings: ?AIMS: 0 ?CIWA:   n/a ?COWS:  n/a  ? ?Musculoskeletal: ?Strength & Muscle Tone: within normal limits ?Gait & Station: normal ?Patient leans: N/A ? ?Psychiatric Specialty Exam: ? ?Presentation  ?General Appearance: Appropriate for Environment ? ?Eye Contact:Good ? ?Speech:Clear and Coherent ? ?Speech Volume:Normal ? ?Handedness:Right ? ?Mood and Affect  ?Mood:Euthymic ? ?Affect:Congruent ? ?Thought Process  ?Thought Processes:Coherent ? ?Descriptions of Associations:Intact ? ?Orientation:Full (Time, Place and Person) ? ?Thought Content:Logical ? ?History of Schizophrenia/Schizoaffective disorder:No ? ?Duration of Psychotic Symptoms:No data recorded ?Hallucinations:Hallucinations: None ? ?Ideas of Reference:None ? ?Suicidal Thoughts:Suicidal Thoughts: No ? ? ?Homicidal Thoughts:Homicidal Thoughts: No ? ?Sensorium  ?Memory:Immediate Good; Recent Good; Remote Good ? ?Judgment:Good ? ?Insight:Good ? ?Executive Functions  ?Concentration:Good ? ?Attention Span:Good ? ?Recall:Good ? ?Fund of  Knowledge:Good ? ?Language:Good ? ?Psychomotor Activity  ?Psychomotor Activity:Psychomotor Activity: Normal ? ?Assets  ?Assets:Communication Skills; Housing; Social Support ? ?Sleep  ?Sleep:Sleep: Good ? ?Physical Exam: ?Physical Exam ?Vitals reviewed.  ?Constitutional:   ?   Appearance: Normal appearance.  ?HENT:  ?   Head: Normocephalic.  ?   Nose: Nose normal. No congestion or rhinorrhea.  ?Eyes:  ?   Pupils: Pupils are equal, round, and reactive to light.  ?Pulmonary:  ?   Effort: Pulmonary effort is normal.  ?Musculoskeletal:  ?   Cervical back: Normal range of motion.  ?Neurological:  ?   Mental Status: He is oriented to person, place, and time.  ?   Sensory: No sensory deficit.  ?   Motor: No weakness.  ?   Coordination: Coordination normal.  ?   Gait: Gait normal.  ?Psychiatric:     ?   Behavior: Behavior normal.     ?   Thought Content: Thought content normal.  ? ?Review of Systems  ?Constitutional: Negative.   ?  HENT: Negative.    ?Eyes: Negative.   ?Respiratory: Negative.    ?Cardiovascular: Negative.   ?Gastrointestinal: Negative.   ?Genitourinary: Negative.   ?Musculoskeletal: Negative.   ?Skin: Negative.   ?Neurological: Negative.   ?Psychiatric/Behavioral:  Positive for depression (improving with current medications). Negative for hallucinations, substance abuse and suicidal ideas. The patient is not nervous/anxious and does not have insomnia.   ?Blood pressure 118/83, pulse 89, temperature (!) 97.5 ?F (36.4 ?C), temperature source Oral, resp. rate 17, height  (1.778 m), weight 81.6 kg, SpO2 97 %. Body mass index is 25.83 kg/m?. ? ?Treatment Plan Summary: ?Daily contact with patient to assess and evaluate symptoms and progress in treatment and Medication management ?   ?  ?PLAN ?Safety and Monitoring: ?Voluntary admission to inpatient psychiatric unit for safety, stabilization and treatment ?Daily contact with patient to assess and evaluate symptoms and progress in treatment ?Patient's case to be  discussed in multi-disciplinary team meeting ?Observation Level : q15 minute checks ?Vital signs: q12 hours ?Precautions: suicide, elopement, and assault ?  ?  ?                  Diagnoses ?Principal Problem: ?  MDD (majo

## 2021-05-22 NOTE — Group Note (Signed)
[facility] LCSW Group Therapy Note ? ?Date/Time:  05/22/2021   ? ?Type of Therapy and Topic:  Group Therapy:  Using Music to Encourage Yourself ? ?Participation Level:  Active  ? ?Description of Group: ?In this process group, members listened to a variety of music through choosing from CSW's list #1 through #25.  Patients identified the messages received from those songs and how the music affected their emotions.  Patients were encouraged to use music as a coping skill at home, but to be mindful of the choices made.  Patients discussed how this knowledge can help with wellness and recovery in various ways including managing depression and anxiety as well as encouraging healthy sleep habits.   ? ?Therapeutic Goals: ?Patients will explore the impact of different songs on mood ?Patients will verbalize the thoughts they have when listening to different types of music ?Patients will identify music that is soothing to them as well as music that is energizing to them ?Patients will discuss how to use this knowledge to assist in maintaining wellness and recovery ?Patients will explore the use of music as a coping skill ?Patients will encourage one another ? ?Summary of Patient Progress:  At the beginning of group, patient expressed that for him, music is a "helper."  He stated he particularly likes to use music to worship God.  He became tearful during one [employer] and asked for the name of it.  He listened attentively to the other music although it did not make him emotional. ? ?Therapeutic Modalities: ?Solution Focused Brief Therapy ?Activity ? ? ?Ambrose Mantle, LCSW ?  ?

## 2021-05-22 NOTE — Progress Notes (Signed)
?   05/22/21 2300  ?Psych Admission Type (Psych Patients Only)  ?Admission Status Voluntary  ?Psychosocial Assessment  ?Patient Complaints Anxiety  ?Eye Contact Fair  ?Facial Expression Flat  ?Affect Appropriate to circumstance  ?Speech Logical/coherent  ?Interaction Assertive  ?Motor Activity Slow  ?Appearance/Hygiene Improved  ?Behavior Characteristics Appropriate to situation  ?Mood Pleasant  ?Thought Process  ?Coherency WDL  ?Content WDL  ?Delusions None reported or observed  ?Perception WDL  ?Hallucination None reported or observed  ?Judgment Poor  ?Confusion None  ?Danger to Self  ?Current suicidal ideation? Denies  ?Self-Injurious Behavior No self-injurious ideation or behavior indicators observed or expressed   ?Agreement Not to Harm Self Yes  ?Description of Agreement Verbal  ?Danger to Others  ?Danger to Others None reported or observed  ? ? ?

## 2021-05-22 NOTE — [facility] Group Notes (Signed)
Adult Psychoeducational Group  ?Date:  05/22/2021 ?Time:  1300-1400 ? ?Group Topic/Focus: Continuation of the group from Saturday. Looking at the lists that were created and talking about what needs to be done with the homework of 30 positives about themselves.  ?                                   Talking about taking their power back and helping themselves to develop Hanson positive self esteem. ?     ?Participation Quality:  Appropriate ? ?Affect:  Appropriate ? ?Cognitive:  Oriented ? ?Insight: Improving ? ?Engagement in Group:  Engaged ? ?Modes of Intervention:  Activity, Discussion, Education, and Support ? ?Additional Comments:  rates his energy at [facility] 10/10. Participated fully in the group. ? ?Mark Hanson ? ?

## 2021-05-22 NOTE — Progress Notes (Signed)
[facility] Group Notes:  (Nursing/MHT/Case Management/Adjunct) ? ?Date:  05/22/2021  ?Time:  2015 ? ?Type of Therapy:   wrap up group ? ?Participation Level:  Active ? ?Participation Quality:  Appropriate, Attentive, Sharing, and Supportive ? ?Affect:  Appropriate ? ?Cognitive:  Alert ? ?Insight:  Improving ? ?Engagement in Group:  Engaged ? ?Modes of Intervention:  Clarification, Education, and Socialization ? ?Summary of Progress/Problems: Positive thinking and self-care were discussed.  ? ?Johann Capers S ?05/22/2021, 11:56 PM ?

## 2021-05-22 NOTE — Progress Notes (Signed)
D:  Patient's self inventory sheet, patient sleeps good, no sleep medication.  Good appetite, normal energy level, good concentration.  Rated depression, hopeless and anxiety 1.  Denied withdrawals.  Denied SI.  Denied physical problems.  Denied physical pain.  Goal is be the best I can be.  Plans to hope and trust in God, attend meeting.  Does have discharge plans. ?A:  Medications administered per MD orders.  Emotional support and encouragement given. ?R:  Denied SI and HI, contracts for safety.  Denied A/V hallucinations.   Safety  maintained with 15  minute checks. ? ?Patient stated he slept 7 hours last night.  "I'm good."    Ate good breakfast. ? ?

## 2021-05-23 ENCOUNTER — Encounter (HOSPITAL_COMMUNITY): Payer: Self-pay

## 2021-05-23 DIAGNOSIS — F332 Major depressive disorder, recurrent severe without psychotic features: Principal | ICD-10-CM

## 2021-05-23 MED ORDER — HYDROXYZINE HCL 25 MG PO TABS: 25.0000 mg | ORAL_TABLET | Freq: Three times a day (TID) | ORAL | 0 refills | Status: DC | PRN

## 2021-05-23 MED ORDER — TRAZODONE HCL 50 MG PO TABS: 50.0000 mg | ORAL_TABLET | Freq: Every evening | ORAL | 0 refills | Status: DC | PRN

## 2021-05-23 MED ORDER — NORTRIPTYLINE HCL 50 MG PO CAPS: 50.0000 mg | ORAL_CAPSULE | Freq: Every day | ORAL | 0 refills | Status: DC

## 2021-05-23 NOTE — BHH Suicide Risk Assessment (Addendum)
Suicide Risk Assessment ? ?Discharge Assessment    ?[facility] Discharge Suicide Risk Assessment ? ? ?Principal Problem: MDD (major depressive disorder), recurrent severe, without psychosis (HCC) ?Discharge Diagnoses: Principal Problem: ?  MDD (major depressive disorder), recurrent severe, without psychosis (HCC) ?Active Problems: ?  OCD (obsessive compulsive disorder) ?  GAD (generalized anxiety disorder) ?  MDD (major depressive disorder), recurrent episode, severe (HCC) ? ?Reason for Admission: Mark Hanson is a 51 y.o male with a history of MDD who presented to the 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 Pt was transferred to this [facility] for treatment and stabilization of his mood. This is pt's first inpatient behavioral health related admission. ? ?                                           HOSPITAL COURSE ?During the patient's hospitalization, patient had extensive initial psychiatric evaluation, and follow-up psychiatric evaluations every day.  Psychiatric diagnoses provided & medication adjustments upon initial assessment were as follows: ?  ?     Diagnoses ?Principal Problem: ?  MDD (major depressive disorder), recurrent severe, without psychosis (HCC) ?Active Problems: ?  OCD (obsessive compulsive disorder) ?  GAD (generalized anxiety disorder) ?   ?                  Medications ?-Continue Zoloft 150 mg daily for MDD ?-Continue Vraylar 1.5 mg daily for Mood stabilization-  ?-Continue Hydroxyzine 25 mg every 6 hours PRN ? ?During the hospitalization, other adjustments were made to the patient's psychiatric medication regimen, with final medication regimen at discharge being as follows: ?-Vraylar was tapered off, and Nortriptyline was started, and slowly titrated up ?-Continue Nortriptyline 50 mg daily starting 05/24/21 ?-Continue Trazodone 50 mg nightly PRN for insomnia ?-Continue Hydroxyzine  25 mg Q6 H PRN for anxiety ? ?Patient's care was discussed during the interdisciplinary team meeting every day during the hospitalization. The patient denies having side effects to prescribed psychiatric medication. The patient was evaluated each day by a clinical provider to ascertain response to treatment. Improvement was noted by the patient's report of decreasing symptoms, improved sleep and appetite, affect, medication tolerance, behavior, and participation in unit programming.  Patient was asked each day to complete a self inventory noting mood, mental status, pain, new symptoms, anxiety and concerns. Symptoms were reported as significantly decreased or resolved completely by discharge. On day of discharge, the patient reports that their mood is stable. The patient denied having suicidal thoughts for more than 48 hours prior to discharge.  Patient denies having homicidal thoughts.  Patient denies having auditory hallucinations.  Patient denies any visual hallucinations or other symptoms of psychosis. The patient is motivated to continue taking medication with a goal of continued improvement in mental health.  ? ?The patient reports their target psychiatric symptoms of depression & anxiety responded well to the psychiatric medications, and the patient reports overall benefit from this psychiatric hospitalization. Supportive psychotherapy was provided to the patient. The patient also participated in regular group therapy while hospitalized. Coping skills, problem solving as well as relaxation therapies were also part of the unit programming. ? ?Labs were reviewed with the patient, and abnormal results were discussed with the patient. TSH was slightly elevated at 4.737, and T4 is  WNL. At time of discharge, T3 is pending, and pt is educated to follow this up with his Primary care Provider. ? ?Total Time spent with patient: 30 minutes ? ?Musculoskeletal: ?Strength & Muscle Tone: within normal limits ?Gait &  Station: normal ?Patient leans: N/A ? ?Psychiatric Specialty Exam ? ?Presentation  ?General Appearance: Appropriate for Environment; Well Groomed ? ?Eye Contact:Good ? ?Speech:Clear and Coherent ? ?Speech Volume:Normal ? ?Handedness:Right ? ?Mood and Affect  ?Mood:Euthymic ? ?Duration of Depression Symptoms: Less than two weeks ? ?Affect:Appropriate ? ?Thought Process  ?Thought Processes:Coherent ? ?Descriptions of Associations:Intact ? ?Orientation:Full (Time, Place and Person) ? ?Thought Content:Logical ? ?History of Schizophrenia/Schizoaffective disorder:No ? ?Duration of Psychotic Symptoms:No data recorded ?Hallucinations:Hallucinations: None ? ?Ideas of Reference:None ? ?Suicidal Thoughts:Suicidal Thoughts: No ? ?Homicidal Thoughts:Homicidal Thoughts: No ? ?Sensorium  ?Memory:Immediate Good; Recent Good ? ?Judgment:Good ? ?Insight:Good ? ?Executive Functions  ?Concentration:Good ? ?Attention Span:Good ? ?Recall:Good ? ?Fund of Knowledge:Good ? ?Language:Good ? ?Psychomotor Activity  ?Psychomotor Activity:Psychomotor Activity: Normal ? ?Assets  ?Assets:Communication Skills ? ?Sleep  ?Sleep:Sleep: Good ? ?Physical Exam: ?Physical Exam ?Constitutional:   ?   Appearance: Normal appearance.  ?HENT:  ?   Head: Normocephalic.  ?   Nose: No congestion or rhinorrhea.  ?Eyes:  ?   Pupils: Pupils are equal, round, and reactive to light.  ?Pulmonary:  ?   Effort: Pulmonary effort is normal.  ?Musculoskeletal:     ?   General: Normal range of motion.  ?   Cervical back: Normal range of motion.  ?Neurological:  ?   Mental Status: He is alert and oriented to person, place, and time.  ?   Sensory: No sensory deficit.  ?   Coordination: Coordination normal.  ?Psychiatric:     ?   Thought Content: Thought content normal.  ? ?Review of Systems  ?Constitutional: Negative.   ?HENT: Negative.    ?Eyes: Negative.   ?Respiratory: Negative.    ?Cardiovascular: Negative.   ?Gastrointestinal: Negative.   ?Genitourinary: Negative.    ?Musculoskeletal: Negative.   ?Skin: Negative.   ?Neurological: Negative.   ?Psychiatric/Behavioral:  Positive for depression (Pt reports that depressive symptoms are resolving, and denies SI/HI/AVH. He also verbally contracts for safety outside of [facility]).   ?Blood pressure 113/89, pulse 85, temperature 97.8 ?F (36.6 ?C), temperature source Oral, resp. rate 16, height 5\' 10"  (1.778 m), weight 81.6 kg, SpO2 98 %. Body mass index is 25.83 kg/m?. ? ?Mental Status Per Nursing Assessment::   ?On Admission:  Suicidal ideation indicated by patient ? ?Demographic Factors:  ?Male ? ?Loss Factors: ?NA ? ?Historical Factors: ?NA ? ?Risk Reduction Factors:   ?Living with another person, especially a relative and Positive social support ? ?Continued Clinical Symptoms:  ?Patient reports that his depressive symptoms are resolving on current medications. He denies SI/HI/AVH, and verbally contracts for safety outside of this [facility]. ? ?Cognitive Features That Contribute To Risk:  ?None   ? ?Suicide Risk:  ?Mild:  There are no identifiable suicide plans, no associated intent, mild dysphoria and related symptoms, good self-control (both objective and subjective assessment), few other risk factors, and identifiable protective factors, including available and accessible social support. ? ? ? Follow-up Information   ? ? Group, [facility] Follow up on 06/14/2021.   ?Specialty: Behavioral Health ?Why: You have an appointment on 06/14/21 at 3:30 pm for medication management services.  You are on the wait  list for an appointment for therapy services. ?Contact information: ?[address] ?[address] ?  [place] [address] ?[phone] ? ? ?  ?  ? ? [facility] Follow up on 05/31/2021.   ?Why: You have an appointment for therapy services on 05/31/21 at 1:00 pm.  This appointment will be VIRTUAL. ?Contact information: ?[address], [place] ?[place] [address] ?[phone] ? ? ?  ?   ? ?  ?  ? ?  ? ?Plan Of Care/Follow-up recommendations:  ? ?The patient is able to verbalize their individual safety plan to this provider. ? ?# It is recommended to the patient to continue psychiatric medications

## 2021-05-23 NOTE — Progress Notes (Signed)
?  [facility] Adult Case Management Discharge Plan : ? ?Will you be returning to the same living situation after discharge:  Yes,  Home with Wife  ?At discharge, do you have transportation home?: Yes,  Wife ?Do you have the ability to pay for your medications: Yes,  Insurance/BCBS ? ?Release of information consent forms completed and in the chart;  Patient's signature needed at discharge. ? ?Patient to Follow up at: ? Follow-up Information   ? ? Group, [facility] Follow up on 06/14/2021.   ?Specialty: Behavioral Health ?Why: You have an appointment on 06/14/21 at 3:30 pm for medication management services.  You are on the wait  list for an appointment for therapy services. ?Contact information: ?[address] ?[address] ?[place] [address] ?[phone] ? ? ?  ?  ? ? [facility] Follow up on 05/31/2021.   ?Why: You have an appointment for therapy services on 05/31/21 at 1:00 pm.  This appointment will be VIRTUAL. ?Contact information: ?[address], [place], [place] ?[place] [address] ?[phone] ? ? ?  ?  ? ?  ?  ? ?  ? ? ?Next level of care provider has access to [facility] Link:no ? ?Safety Planning and Suicide Prevention discussed: Yes,  with patient  ? ?  ? ?Has patient been referred to the Quitline?: N/A patient is not a smoker ? ?Patient has been referred for addiction treatment: N/A ? ?Aram Beecham, LCSWA ?05/23/2021, 9:12 AM ?

## 2021-05-23 NOTE — Progress Notes (Signed)
?   05/23/21 0740  ?Psych Admission Type (Psych Patients Only)  ?Admission Status Voluntary  ?Psychosocial Assessment  ?Patient Complaints None  ?Eye Contact Fair  ?Facial Expression Flat  ?Affect Appropriate to circumstance  ?Speech Logical/coherent  ?Interaction Assertive  ?Motor Activity Other (Comment) ?(WDL)  ?Appearance/Hygiene Unremarkable  ?Behavior Characteristics Cooperative;Appropriate to situation;Calm  ?Mood Pleasant  ?Thought Process  ?Coherency WDL  ?Content WDL  ?Delusions None reported or observed  ?Perception WDL  ?Hallucination None reported or observed  ?Judgment Impaired  ?Confusion None  ?Danger to Self  ?Current suicidal ideation? Denies  ?Danger to Others  ?Danger to Others None reported or observed  ? ? ?

## 2021-05-23 NOTE — BH IP Treatment Plan (Signed)
Interdisciplinary Treatment and Diagnostic Plan Update ? ?05/23/2021 ?Time of Session: 10:00am  ?Vertis Kelchhilip Suazo ?MRN: [id number] ? ?Principal Diagnosis: MDD (major depressive disorder), recurrent severe, without psychosis (HCC) ? ?Secondary Diagnoses: Principal Problem: ?  MDD (major depressive disorder), recurrent severe, without psychosis (HCC) ?Active Problems: ?  OCD (obsessive compulsive disorder) ?  GAD (generalized anxiety disorder) ?  MDD (major depressive disorder), recurrent episode, severe (HCC) ? ? ?Current Medications:  ?Current Facility-Administered Medications  ?Medication Dose Route Frequency Provider Last Rate Last Admin  ? acetaminophen (TYLENOL) tablet 650 mg  650 mg Oral Q6H PRN Jackelyn PolingBerry, Jason A, NP      ? alum & mag hydroxide-simeth (MAALOX/MYLANTA) 200-200-20 MG/5ML suspension 30 mL  30 mL Oral Q4H PRN Jackelyn PolingBerry, Jason A, NP      ? hydrOXYzine (ATARAX) tablet 25 mg  25 mg Oral TID PRN Jackelyn PolingBerry, Jason A, NP      ? magnesium hydroxide (MILK OF MAGNESIA) suspension 30 mL  30 mL Oral Daily PRN Jackelyn PolingBerry, Jason A, NP      ? [START ON 05/24/2021] nortriptyline (PAMELOR) capsule 50 mg  50 mg Oral Daily Massengill, Nathan, MD      ? traZODone (DESYREL) tablet 50 mg  50 mg Oral QHS PRN Phineas InchesMassengill, Nathan, MD      ? ?Current Outpatient Medications  ?Medication Sig Dispense Refill  ? hydrOXYzine (ATARAX) 25 MG tablet Take 1 tablet (25 mg total) by mouth 3 (three) times daily as needed for anxiety. 30 tablet 0  ? [START ON 05/24/2021] nortriptyline (PAMELOR) 50 MG capsule Take 1 capsule (50 mg total) by mouth daily. 30 capsule 0  ? traZODone (DESYREL) 50 MG tablet Take 1 tablet (50 mg total) by mouth at bedtime as needed for sleep. 30 tablet 0  ? ?PTA Medications: ?No medications prior to admission.  ? ? ?Patient Stressors: Marital or family conflict   ?Medication change or noncompliance   ? ?Patient Strengths: General fund of knowledge  ?Motivation for treatment/growth  ?Supportive family/friends  ? ?Treatment Modalities:  Medication Management, Group therapy, Case management,  ?1 to 1 session with clinician, Psychoeducation, Recreational therapy. ? ? ?Physician Treatment Plan for Primary Diagnosis: MDD (major depressive disorder), recurrent severe, without psychosis (HCC) ?Long Term Goal(s): Improvement in symptoms so as ready for discharge  ? ?Short Term Goals: Ability to verbalize feelings will improve ?Ability to disclose and discuss suicidal ideas ?Ability to demonstrate self-control will improve ?Ability to maintain clinical measurements within normal limits will improve ?Compliance with prescribed medications will improve ? ?Medication Management: Evaluate patient's response, side effects, and tolerance of medication regimen. ? ?Therapeutic Interventions: 1 to 1 sessions, Unit Group sessions and Medication administration. ? ?Evaluation of Outcomes: Adequate for Discharge ? ?Physician Treatment Plan for Secondary Diagnosis: Principal Problem: ?  MDD (major depressive disorder), recurrent severe, without psychosis (HCC) ?Active Problems: ?  OCD (obsessive compulsive disorder) ?  GAD (generalized anxiety disorder) ?  MDD (major depressive disorder), recurrent episode, severe (HCC) ? ?Long Term Goal(s): Improvement in symptoms so as ready for discharge  ? ?Short Term Goals: Ability to verbalize feelings will improve ?Ability to disclose and discuss suicidal ideas ?Ability to demonstrate self-control will improve ?Ability to maintain clinical measurements within normal limits will improve ?Compliance with prescribed medications will improve    ? ?Medication Management: Evaluate patient's response, side effects, and tolerance of medication regimen. ? ?Therapeutic Interventions: 1 to 1 sessions, Unit Group sessions and Medication administration. ? ?Evaluation of Outcomes: Adequate for Discharge ? ? ?RN  Treatment Plan for Primary Diagnosis: MDD (major depressive disorder), recurrent severe, without psychosis (HCC) ?Long Term Goal(s):  Knowledge of disease and therapeutic regimen to maintain health will improve ? ?Short Term Goals: Ability to remain free from injury will improve, Ability to participate in decision making will improve, Ability to verbalize feelings will improve, Ability to disclose and discuss suicidal ideas, and Ability to identify and develop effective coping behaviors will improve ? ?Medication Management: RN will administer medications as ordered by provider, will assess and evaluate patient's response and provide education to patient for prescribed medication. RN will report any adverse and/or side effects to prescribing provider. ? ?Therapeutic Interventions: 1 on 1 counseling sessions, Psychoeducation, Medication administration, Evaluate responses to treatment, Monitor vital signs and CBGs as ordered, Perform/monitor CIWA, COWS, AIMS and Fall Risk screenings as ordered, Perform wound care treatments as ordered. ? ?Evaluation of Outcomes: Adequate for Discharge ? ? ?LCSW Treatment Plan for Primary Diagnosis: MDD (major depressive disorder), recurrent severe, without psychosis (HCC) ?Long Term Goal(s): Safe transition to appropriate next level of care at discharge, Engage patient in therapeutic group addressing interpersonal concerns. ? ?Short Term Goals: Engage patient in aftercare planning with referrals and resources, Increase social support, Increase emotional regulation, Facilitate acceptance of mental health diagnosis and concerns, Identify triggers associated with mental health/substance abuse issues, and Increase skills for wellness and recovery ? ?Therapeutic Interventions: Assess for all discharge needs, 1 to 1 time with Child psychotherapist, Explore available resources and support systems, Assess for adequacy in community support network, Educate family and significant other(s) on suicide prevention, Complete Psychosocial Assessment, Interpersonal group therapy. ? ?Evaluation of Outcomes: Adequate for  Discharge ? ? ?Progress in Treatment: ?Attending groups: Yes. ?Participating in groups: Yes. ?Taking medication as prescribed: Yes. ?Toleration medication: Yes. ?Family/Significant other contact made: No, will contact:  Declined Consents ?Patient understands diagnosis: Yes. ?Discussing patient identified problems/goals with staff: Yes. ?Medical problems stabilized or resolved: Yes. ?Denies suicidal/homicidal ideation: Yes. ?Issues/concerns per patient self-inventory: No. ?  ?  ?New problem(s) identified: No, Describe:  None  ?  ?New Short Term/Long Term Goal(s): medication stabilization, elimination of SI thoughts, development of comprehensive mental wellness plan.  ?  ?Patient Goals: "To relax and overcome fear" ?  ?Discharge Plan or Barriers: Patient will return home with wife and will follow up with [facility] for medication management and [facility] for therapy.  ?  ?Reason for Continuation of Hospitalization:  ?Medication stabilization ? ?  ?Estimated Length of Stay: Adequate for Discharge  ? ?Last 3 [place] Suicide Severity Risk Score: ?Flowsheet Row [facility]  ?C-SSRS RISK CATEGORY High Risk High Risk  ? ?  ? ? ?Last PHQ 2/9 Scores: ? ?  05/17/2021  ?  2:36 PM 05/17/2021  ?  1:11 PM 03/21/2021  ?  3:49 PM  ?Depression screen PHQ 2/9  ?Decreased Interest 3    ?Down, Depressed, Hopeless 3    ?PHQ - 2 Score 6    ?Altered sleeping 2    ?Tired, decreased energy 2    ?Change in appetite     ?Feeling bad or failure about yourself  3    ?Trouble concentrating 2    ?Moving slowly or fidgety/restless 0    ?Suicidal thoughts 3    ?PHQ-9 Score 18    ?Difficult doing work/chores Somewhat difficult    ?  ? Information is confidential and restricted. Go  to Review Flowsheets to unlock data.  ? ? ?Scribe for Treatment Team: ?Aram Beecham, Theresia Majors ?05/23/2021 ?11:36 AM ? ? ?

## 2021-05-23 NOTE — Discharge Summary (Addendum)
Physician Discharge Summary Note  Patient:  Mark Hanson is an 51 y.o., male MRN:  [id number] DOB:  09-11-1970 Patient phone:  [phone] (home)  Patient address:   [address] 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 Pt was transferred to this [facility] for treatment and stabilization of his mood. This is pt's first inpatient behavioral health related admission.                                      HOSPITAL COURSE During the patient's hospitalization, patient had extensive initial psychiatric evaluation, and follow-up psychiatric evaluations every day.  Psychiatric diagnoses provided & medication adjustments upon initial assessment were as follows:        Diagnoses Principal Problem:   MDD (major depressive disorder), recurrent severe, without psychosis (HCC) Active Problems:   OCD (obsessive compulsive disorder)   GAD (generalized anxiety disorder)    On admission, the following medication recommendations were made:                  -Continue Zoloft 150 mg daily for MDD -Continue Vraylar 1.5 mg daily for Mood stabilization-  -Continue Hydroxyzine 25 mg every 6 hours PRN   During the hospitalization, other adjustments were made to the patient's psychiatric medication regimen: -Vraylar was tapered to stop -Sertraline was cross-titrated (and stopped) to Nortriptyline 50 mg once daily   Patient's care was discussed during the interdisciplinary team meeting every day during the hospitalization. The patient denies having side effects to prescribed psychiatric medication. The patient was evaluated each day by a clinical provider to ascertain  response to treatment. Improvement was noted by the patient's report of decreasing symptoms, improved sleep and appetite, affect, medication tolerance, behavior, and participation in unit programming.  Patient was asked each day to complete a self inventory noting mood, mental status, pain, new symptoms, anxiety and concerns. Symptoms were reported as significantly decreased or resolved completely by discharge. On day of discharge, the patient reports that their mood is stable. The patient denied having suicidal thoughts for more than 48 hours prior to discharge.  Patient denies having homicidal thoughts.  Patient denies having auditory hallucinations.  Patient denies any visual hallucinations or other symptoms of psychosis. The patient is motivated to continue taking medication with a goal of continued improvement in mental health.    The patient reports their target psychiatric symptoms of depression & anxiety responded well to the psychiatric medications, and the patient reports overall benefit from this psychiatric hospitalization. Supportive psychotherapy was provided to the patient. The patient also participated in regular group therapy while hospitalized. Coping skills, problem solving as well as relaxation therapies were also part of the unit programming.   Labs were reviewed with the patient, and abnormal results were discussed with the patient. TSH was slightly elevated at 4.737, and T4 is WNL. At time of discharge, T3 is pending, and pt is educated to follow this up with his Primary care Provider.  Principal Problem: MDD (major depressive disorder), recurrent severe, without psychosis (HCC) Discharge Diagnoses: Principal Problem:  MDD (major depressive disorder), recurrent severe, without psychosis (HCC) Active Problems:   OCD (obsessive compulsive disorder)   GAD (generalized anxiety disorder)   MDD (major depressive disorder), recurrent episode, severe (HCC)  Past Psychiatric History: As  above  Past Medical History:  Past Medical History:  Diagnosis Date   ADD (attention deficit disorder)    History reviewed. No pertinent surgical history. Family History: History reviewed. No pertinent family history. Family Psychiatric  History: none reported Social History:  Social History   Substance and Sexual Activity  Alcohol Use No     Social History   Substance and Sexual Activity  Drug Use No    Social History   Socioeconomic History   Marital status: Married    Spouse name: [name]   Number of children: 1   Years of education: 12   Highest education level: Bachelor's degree (e.g., BA, AB, BS)  Occupational History   Occupation: Barista    Comment: [employer]  Tobacco Use   Smoking status: Never   Smokeless tobacco: Never  Vaping Use   Vaping Use: Never used  Substance and Sexual Activity   Alcohol use: No   Drug use: No   Sexual activity: Yes    Birth control/protection: None  Other Topics Concern   Not on file  Social History Narrative   Lives with wife and son in [place].    Social Determinants of Health   Financial Resource Strain: Not on file  Food Insecurity: Not on file  Transportation Needs: Not on file  Physical Activity: Not on file  Stress: Not on file  Social Connections: Not on file   Physical Findings: AIMS: Facial and Oral Movements Muscles of Facial Expression: None, normal Lips and Perioral Area: None, normal Jaw: None, normal Tongue: None, normal,Extremity Movements Upper (arms, wrists, hands, fingers): None, normal Lower (legs, knees, ankles, toes): None, normal, Trunk Movements Neck, shoulders, hips: None, normal, Overall Severity Severity of abnormal movements (highest score from questions above): None, normal Incapacitation due to abnormal movements: None, normal Patient's awareness of abnormal movements (rate only patient's report): No Awareness, Dental Status Current problems with teeth and/or dentures?:  No Does patient usually wear dentures?: No  CIWA:  n/a  COWS:   n/a AIMS:0  Musculoskeletal: Strength & Muscle Tone: within normal limits Gait & Station: normal Patient leans: N/A   Psychiatric Specialty Exam:  Presentation  General Appearance: Appropriate for Environment; Casual; Fairly Groomed  Eye Contact:Good  Speech:Clear and Coherent; Normal Rate  Speech Volume:Normal  Handedness:Right   Mood and Affect  Mood:Euthymic  Affect:Appropriate; Congruent; Full Range   Thought Process  Thought Processes:Linear  Descriptions of Associations:Intact  Orientation:Full (Time, Place and Person)  Thought Content:Logical  History of Schizophrenia/Schizoaffective disorder:No  Duration of Psychotic Symptoms:No data recorded Hallucinations:Hallucinations: None  Ideas of Reference:None  Suicidal Thoughts:Suicidal Thoughts: No  Homicidal Thoughts:Homicidal Thoughts: No  Sensorium  Memory:Immediate Good; Recent Good; Remote Good  Judgment:Good  Insight:Good  Executive Functions  Concentration:Good  Attention Span:Good  Recall:Good  Fund of Knowledge:Good  Language:Good  Psychomotor Activity  Psychomotor Activity:Psychomotor Activity: Normal  Assets  Assets:Communication Skills  Sleep  Sleep:Sleep: Good  Physical Exam: Physical Exam Constitutional:      Appearance: Normal appearance.  HENT:     Head: Normocephalic.     Nose: Nose normal. No congestion or rhinorrhea.  Eyes:     Pupils: Pupils are equal, round, and reactive to light.  Pulmonary:     Effort: Pulmonary effort  is normal. No respiratory distress.  Musculoskeletal:        General: Normal range of motion.     Cervical back: Normal range of motion.  Neurological:     Mental Status: He is alert and oriented to person, place, and time.   Review of Systems  Constitutional: Negative.   HENT: Negative.    Eyes: Negative.   Respiratory: Negative.    Cardiovascular: Negative.    Gastrointestinal: Negative.   Genitourinary: Negative.   Musculoskeletal: Negative.   Skin: Negative.   Neurological: Negative.   Psychiatric/Behavioral:  Positive for depression (Pt reports that symptoms are resolving on current medications. He denies SI/HI/AVH and verbally contracts for safety outside of this [facility]). Negative for hallucinations, memory loss, substance abuse and suicidal ideas. The patient is not nervous/anxious (Symptoms resolving with current medications) and does not have insomnia.    Blood pressure 113/89, pulse 85, temperature 97.8 F (36.6 C), temperature source Oral, resp. rate 16, height 5\' 10"  (1.778 m), weight 81.6 kg, SpO2 98 %. Body mass index is 25.83 kg/m.  Social History   Tobacco Use  Smoking Status Never  Smokeless Tobacco Never   Tobacco Cessation:  N/A, patient does not currently use tobacco products  Blood Alcohol level:  No results found for: [facility]  Metabolic Disorder Labs:  Lab Results  Component Value Date   HGBA1C 5.5 05/18/2021   MPG 111.15 05/18/2021   No results found for: PROLACTIN Lab Results  Component Value Date   CHOL 190 05/18/2021   TRIG 54 05/18/2021   HDL 61 05/18/2021   CHOLHDL 3.1 05/18/2021   VLDL 11 05/18/2021   LDLCALC 118 (H) 05/18/2021   See Psychiatric Specialty Exam and Suicide Risk Assessment completed by Attending Physician prior to discharge.  Discharge destination:  Home  Is patient on multiple antipsychotic therapies at discharge:  No   Has Patient had three or more failed trials of antipsychotic monotherapy by history:  No  Recommended Plan for Multiple Antipsychotic Therapies: NA  Discharge Instructions     Diet - low sodium heart healthy   Complete by: As directed    Increase activity slowly   Complete by: As directed       Allergies as of 05/23/2021       Reactions   Penicillins Anaphylaxis        Medication List     STOP taking these medications    cariprazine 1.5 MG  capsule Commonly known as: Vraylar   sertraline 100 MG tablet Commonly known as: ZOLOFT       TAKE these medications      Indication  hydrOXYzine 25 MG tablet Commonly known as: ATARAX Take 1 tablet (25 mg total) by mouth 3 (three) times daily as needed for anxiety.  Indication: Feeling Anxious   nortriptyline 50 MG capsule Commonly known as: PAMELOR Take 1 capsule (50 mg total) by mouth daily. Start taking on: May 24, 2021  Indication: Depression   traZODone 50 MG tablet Commonly known as: DESYREL Take 1 tablet (50 mg total) by mouth at bedtime as needed for sleep.  Indication: Trouble Sleeping        Follow-up Information     [facility] up on 06/14/2021.   Specialty: Behavioral Health Why: You have an appointment on 06/14/21 at 3:30 pm for medication management services.  You are on the wait  list for an appointment for therapy services. Contact information: [address] [place] [address] [phone]  [facility] Follow up on 05/31/2021.   Why: You have an appointment for therapy services on 05/31/21 at 1:00 pm.  This appointment will be VIRTUAL. Contact information: [address] [place], [place] [address] [phone]                Follow-up recommendations:   The patient is able to verbalize their individual safety plan to this provider.   # It is recommended to the patient to continue psychiatric medications as prescribed, after discharge from the hospital.     # It is recommended to the patient to follow up with your outpatient psychiatric provider and PCP.   # It was discussed with the patient, the impact of alcohol, drugs, tobacco have been there overall psychiatric and medical wellbeing, and total abstinence from substance use was recommended the patient.ed.   # Prescriptions provided or sent directly to preferred pharmacy at discharge.  Patient agreeable to plan. Given opportunity to ask questions. Appears to feel comfortable with discharge.    # In the event of worsening symptoms, the patient is instructed to call the crisis hotline (988), 911 and or go to the nearest ED for appropriate evaluation and treatment of symptoms. To follow-up with primary care provider for other medical issues, concerns and or health care needs   # Patient was discharged home with a plan to follow up as noted above.   Signed: Starleen Blue, NP 05/23/2021, 5:12 PM  Total Time Spent in Direct Patient Care:  I personally spent 40 minutes on the unit in direct patient care. The direct patient care time included face-to-face time with the patient, reviewing the patient's chart, communicating with other professionals, and coordinating care. Greater than 50% of this time was spent in counseling or coordinating care with the patient regarding goals of hospitalization, psycho-education, and discharge planning needs.  On my assessment the patient denied SI, HI, AVH, paranoia, ideas of reference, or first rank symptoms on day of discharge. Patient denied drug cravings or active signs of withdrawal. Patient denied medication side-effects. Patient was not deemed to be a danger to self or others on day of discharge and was in agreement with discharge plans.   I have independently evaluated the patient during a face-to-face assessment on the day of discharge. I reviewed the patient's chart, and I participated in key portions of the service. I discussed the case with the APP, and I agree with the assessment and plan of care as documented in the APP's note, as addended by me or notated below:  I directly edited the dc summary, as above. Pt tolerated cross titration from zoloft to nortriptyline well w/o reported s/e.  Vraylar was stopped.   Phineas Inches, MD Psychiatrist

## 2021-05-23 NOTE — Group Note (Signed)
Date:  05/23/2021 ?Time:  10:48 AM ? ?Group Topic/Focus:  ?Goals Group:   The focus of this group is to help patients establish daily goals to achieve during treatment and discuss how the patient can incorporate goal setting into their daily lives to aide in recovery. ?Orientation:   The focus of this group is to educate the patient on the purpose and policies of crisis stabilization and provide a format to answer questions about their admission.  The group details unit policies and expectations of patients while admitted. ? ? ? ?Participation Level:  Active ? ?Participation Quality:  Appropriate ? ?Affect:  Appropriate ? ?Cognitive:  Appropriate ? ?Insight: Appropriate ? ?Engagement in Group:  Engaged ? ?Modes of Intervention:  Discussion ? ?Additional Comments:  Pt is happy to be discharged today. ? ?Jaquita Rector ?05/23/2021, 10:48 AM ? ?

## 2021-05-23 NOTE — Progress Notes (Signed)
D: Mark Hanson A & O X 4. Denies SI, HI, AVH and pain at this time. D/C home as ordered. Picked up in lobby by his wife. ?A: D/C instructions reviewed with Mark Hanson including electronic prescriptions and follow up appointments; compliance encouraged. Mark Hanson had no belongings in locker at time of departure. Scheduled medications given with verbal education and effects monitored. Safety checks maintained without incident till time of d/c.  ?R: Mark Hanson receptive to care. Compliant with medications when offered. Denies adverse drug reactions when assessed. Verbalized understanding related to d/c instructions. Signed belonging sheet in agreement with no items from locker. Ambulatory with a steady gait. Appears to be in no physical distress at time of departure.  ? ?

## 2021-05-24 LAB — T3, FREE: T3, Free: 2.9 pg/mL (ref 2.0–4.4)

## 2021-05-26 LAB — T3, FREE: T3, Free: 3.4 pg/mL (ref 2.0–4.4)

## 2021-05-27 ENCOUNTER — Other Ambulatory Visit: Payer: Self-pay | Admitting: Behavioral Health

## 2021-05-27 DIAGNOSIS — F429 Obsessive-compulsive disorder, unspecified: Secondary | ICD-10-CM

## 2021-05-27 DIAGNOSIS — F411 Generalized anxiety disorder: Secondary | ICD-10-CM

## 2021-05-27 NOTE — Telephone Encounter (Signed)
This was discontinued in the hospital. I assume you don't want to restart it. His follow up end of this month. ?

## 2021-05-27 NOTE — Telephone Encounter (Signed)
Yes, inappropriate to refill at this time.

## 2021-06-03 ENCOUNTER — Telehealth (HOSPITAL_COMMUNITY): Payer: Self-pay | Admitting: Internal Medicine

## 2021-06-03 NOTE — BH Assessment (Signed)
Care Management - Follow Up [facility] Discharges   Patient has been placed in an inpatient psychiatric hospital [facility]) on 05-18-2021.

## 2021-06-06 ENCOUNTER — Other Ambulatory Visit: Payer: Self-pay | Admitting: Behavioral Health

## 2021-06-06 ENCOUNTER — Telehealth: Payer: Self-pay | Admitting: Behavioral Health

## 2021-06-06 DIAGNOSIS — F39 Unspecified mood [affective] disorder: Secondary | ICD-10-CM

## 2021-06-06 DIAGNOSIS — F5105 Insomnia due to other mental disorder: Secondary | ICD-10-CM

## 2021-06-06 DIAGNOSIS — F332 Major depressive disorder, recurrent severe without psychotic features: Secondary | ICD-10-CM

## 2021-06-06 DIAGNOSIS — F429 Obsessive-compulsive disorder, unspecified: Secondary | ICD-10-CM

## 2021-06-06 DIAGNOSIS — F410 Panic disorder [episodic paroxysmal anxiety] without agoraphobia: Secondary | ICD-10-CM

## 2021-06-06 DIAGNOSIS — F411 Generalized anxiety disorder: Secondary | ICD-10-CM

## 2021-06-06 MED ORDER — TRAZODONE HCL 50 MG PO TABS: 50.0000 mg | ORAL_TABLET | Freq: Every evening | ORAL | 1 refills | Status: DC | PRN

## 2021-06-06 MED ORDER — NORTRIPTYLINE HCL 50 MG PO CAPS: 50.0000 mg | ORAL_CAPSULE | Freq: Every day | ORAL | 0 refills | Status: DC

## 2021-06-06 MED ORDER — HYDROXYZINE HCL 25 MG PO TABS: 25.0000 mg | ORAL_TABLET | Freq: Three times a day (TID) | ORAL | 1 refills | Status: DC

## 2021-06-06 NOTE — Telephone Encounter (Signed)
Pt is out of Hydroxyzine 25 mg. 3/d PRN. Fill date 5/8  #30. Wife [name] stated he has been taking 3/d.  [facility] [address]

## 2021-06-06 NOTE — Telephone Encounter (Signed)
Sent RX of all of his current med to [facility].

## 2021-06-14 ENCOUNTER — Ambulatory Visit: Payer: BC Managed Care – PPO | Admitting: Behavioral Health

## 2021-06-14 ENCOUNTER — Encounter: Payer: Self-pay | Admitting: Behavioral Health

## 2021-06-14 DIAGNOSIS — F5105 Insomnia due to other mental disorder: Secondary | ICD-10-CM

## 2021-06-14 DIAGNOSIS — F411 Generalized anxiety disorder: Secondary | ICD-10-CM | POA: Diagnosis not present

## 2021-06-14 DIAGNOSIS — F39 Unspecified mood [affective] disorder: Secondary | ICD-10-CM

## 2021-06-14 DIAGNOSIS — F429 Obsessive-compulsive disorder, unspecified: Secondary | ICD-10-CM

## 2021-06-14 DIAGNOSIS — F331 Major depressive disorder, recurrent, moderate: Secondary | ICD-10-CM | POA: Diagnosis not present

## 2021-06-14 DIAGNOSIS — F99 Mental disorder, not otherwise specified: Secondary | ICD-10-CM

## 2021-06-14 DIAGNOSIS — F410 Panic disorder [episodic paroxysmal anxiety] without agoraphobia: Secondary | ICD-10-CM

## 2021-06-14 MED ORDER — TRAZODONE HCL 50 MG PO TABS: 50.0000 mg | ORAL_TABLET | Freq: Every evening | ORAL | 3 refills | Status: DC | PRN

## 2021-06-14 MED ORDER — HYDROXYZINE HCL 25 MG PO TABS: 25.0000 mg | ORAL_TABLET | Freq: Three times a day (TID) | ORAL | 3 refills | Status: DC

## 2021-06-14 MED ORDER — NORTRIPTYLINE HCL 50 MG PO CAPS: 50.0000 mg | ORAL_CAPSULE | Freq: Every day | ORAL | 3 refills | Status: DC

## 2021-06-14 NOTE — Progress Notes (Signed)
[facility]  Patient ID: Mark Hanson,  MRN: [id number]  PCP: Chauncy Lean, PA-C  Date of Evaluation: 06/14/2021 Time spent:30 minutes  Chief Complaint:  Chief Complaint   Anxiety; Depression; OCD; Follow-up; Medication Refill     HISTORY/CURRENT STATUS: HPI  51 year old male presents to this office for follow up and medication management. He has very flat subdued affect, but calm and rational. He says he is thankful for seeking help and his hospitalization at behavioral health. Says that it made him realize that there were other people like him suffering. Says that he participated in group therapy and made some new friends.  He would like to continue with current meds without changes. He agree to f/u in 4 weeks but does not want to continue to miss much work.  Says that he is not suicidal and feels safe at home and work. Says his anxiety is 3/10 and depression is 3/10. He is sleeping 7-8 hours per night. He denies any mania, no psychosis. No SI/HI. He verbally contracted for safety today and understands his resources if needed.    Previous psychiatric medications: Zoloft Clomipramine 100 Paroxetine,  Pristiq- hyper Luvox-one month  Individual Medical History/ Review of Systems: Changes? :No   Allergies: Penicillins  Current Medications:  Current Outpatient Medications:    hydrOXYzine (ATARAX) 25 MG tablet, Take 1 tablet (25 mg total) by mouth 3 (three) times daily., Disp: 90 tablet, Rfl: 3   nortriptyline (PAMELOR) 50 MG capsule, Take 1 capsule (50 mg total) by mouth daily., Disp: 30 capsule, Rfl: 3   traZODone (DESYREL) 50 MG tablet, Take 1 tablet (50 mg total) by mouth at bedtime as needed for sleep., Disp: 30 tablet, Rfl: 3 Medication Side Effects: none  Family Medical/ Social History: Changes? No  MENTAL HEALTH EXAM:  There were no vitals taken for this visit.There is no height or weight on file to calculate BMI.  General Appearance: Casual, Neat,  and Well Groomed  Eye Contact:  Good  Speech:  Clear and Coherent  Volume:  Normal  Mood:  NA  Affect:  Appropriate  Thought Process:  Coherent  Orientation:  Full (Time, Place, and Person)  Thought Content: Logical   Suicidal Thoughts:  No  Homicidal Thoughts:  No  Memory:  WNL  Judgement:  Good  Insight:  Good  Psychomotor Activity:  Normal  Concentration:  Concentration: Good  Recall:  Good  Fund of Knowledge: Good  Language: Good  Assets:  Desire for Improvement  ADL's:  Intact  Cognition: WNL  Prognosis:  Good    DIAGNOSES:    ICD-10-CM   1. Major depressive disorder, recurrent episode, moderate (HCC)  F33.1     2. Generalized anxiety disorder  F41.1 hydrOXYzine (ATARAX) 25 MG tablet    nortriptyline (PAMELOR) 50 MG capsule    3. Obsessive-compulsive disorder, unspecified type  F42.9 nortriptyline (PAMELOR) 50 MG capsule    4. Unspecified mood (affective) disorder (HCC)  F39 nortriptyline (PAMELOR) 50 MG capsule    5. Panic attack  F41.0 nortriptyline (PAMELOR) 50 MG capsule    6. Insomnia due to other mental disorder  F51.05 traZODone (DESYREL) 50 MG tablet   F99       Receiving Psychotherapy: No    RECOMMENDATIONS:    Greater than 50% of  30 min face to face time with patient was spent on counseling and coordination of care. We discussed his recent improvement with anxiety and severe depression with suicidal ideation. He has not  had any more SI since his hospitalization for 6 days at [facility]. He says that his evaluation and stay was a positive experience and glad that he sought help.  He as returned to work. He would like to stay on current medications and not make any further changes at this time.   We agreed to: Continue Nortriptyline 50 mg capsule  daily Continue Trazodone 50 mg at bedtime Continue hydroxyzine 25 mg three time daily as needed. To report any worsening symptoms or side effects promptly To follow up in 4 weeks to reassess  Provided  emergency contact information including suicide hotline. He understands he can call 911 or go to the nearest emergency dept. Recommended he seek psychotherapy and he will be reaching out to resources provided.  Discussed potential metabolic side effects associated with atypical antipsychotics, as well as potential risk for movement side effects. Advised pt to contact office if movement side effects occur.     Reviewed PDMP        Joan Flores, NP

## 2021-07-09 ENCOUNTER — Other Ambulatory Visit: Payer: Self-pay | Admitting: Behavioral Health

## 2021-07-09 DIAGNOSIS — F5105 Insomnia due to other mental disorder: Secondary | ICD-10-CM

## 2021-07-09 DIAGNOSIS — F410 Panic disorder [episodic paroxysmal anxiety] without agoraphobia: Secondary | ICD-10-CM

## 2021-07-09 DIAGNOSIS — F411 Generalized anxiety disorder: Secondary | ICD-10-CM

## 2021-07-09 DIAGNOSIS — F429 Obsessive-compulsive disorder, unspecified: Secondary | ICD-10-CM

## 2021-07-09 DIAGNOSIS — F39 Unspecified mood [affective] disorder: Secondary | ICD-10-CM

## 2021-07-11 NOTE — Telephone Encounter (Signed)
Has appt with Aaron Edelman tomorrow.

## 2021-07-12 ENCOUNTER — Ambulatory Visit (INDEPENDENT_AMBULATORY_CARE_PROVIDER_SITE_OTHER): Payer: Self-pay | Admitting: Behavioral Health

## 2021-07-12 DIAGNOSIS — F489 Nonpsychotic mental disorder, unspecified: Secondary | ICD-10-CM

## 2021-07-22 ENCOUNTER — Encounter: Payer: Self-pay | Admitting: Behavioral Health

## 2021-07-22 ENCOUNTER — Ambulatory Visit: Payer: BC Managed Care – PPO | Admitting: Behavioral Health

## 2021-07-22 ENCOUNTER — Ambulatory Visit (INDEPENDENT_AMBULATORY_CARE_PROVIDER_SITE_OTHER): Payer: BC Managed Care – PPO | Admitting: Psychology

## 2021-07-22 DIAGNOSIS — F39 Unspecified mood [affective] disorder: Secondary | ICD-10-CM | POA: Diagnosis not present

## 2021-07-22 DIAGNOSIS — F99 Mental disorder, not otherwise specified: Secondary | ICD-10-CM

## 2021-07-22 DIAGNOSIS — F429 Obsessive-compulsive disorder, unspecified: Secondary | ICD-10-CM | POA: Diagnosis not present

## 2021-07-22 DIAGNOSIS — F5105 Insomnia due to other mental disorder: Secondary | ICD-10-CM

## 2021-07-22 DIAGNOSIS — F411 Generalized anxiety disorder: Secondary | ICD-10-CM | POA: Diagnosis not present

## 2021-07-22 DIAGNOSIS — F331 Major depressive disorder, recurrent, moderate: Secondary | ICD-10-CM

## 2021-07-22 DIAGNOSIS — F410 Panic disorder [episodic paroxysmal anxiety] without agoraphobia: Secondary | ICD-10-CM

## 2021-07-22 MED ORDER — TRAZODONE HCL 50 MG PO TABS: 50.0000 mg | ORAL_TABLET | Freq: Every evening | ORAL | 1 refills | Status: DC | PRN

## 2021-07-22 MED ORDER — HYDROXYZINE HCL 25 MG PO TABS: 25.0000 mg | ORAL_TABLET | Freq: Three times a day (TID) | ORAL | 3 refills | Status: DC

## 2021-07-22 MED ORDER — NORTRIPTYLINE HCL 50 MG PO CAPS: ORAL_CAPSULE | ORAL | 1 refills | Status: DC

## 2021-07-22 NOTE — Progress Notes (Signed)
[facility] Counselor Initial Adult Exam  Name: Mark Hanson Date: 07/22/2021 MRN: [id number] DOB: Apr 28, 1970 PCP: Chauncy Lean, PA-C  Time spent: 45 mins  Guardian/Payee:  Pt    Paperwork requested: No   Reason for Visit /Presenting Problem: Pt presents for session via phone due to technical issues.  Pt granted consent for session, stating that he is in his home with no one else present.  I shared with pt that I am in my office at home with no one else present here.  Pt prefers to be called Mark Hanson.  Mental Status Exam: Appearance:   Casual     Behavior:  Appropriate  Motor:  Normal  Speech/Language:   Clear and Coherent  Affect:  Appropriate  Mood:  normal  Thought process:  normal  Thought content:    WNL  Sensory/Perceptual disturbances:    WNL  Orientation:  oriented to person, place, and time/date  Attention:  Good  Concentration:  Good and Fair  Memory:  WNL  Fund of knowledge:   Good  Insight:    Good  Judgment:   Good  Impulse Control:  Good    Reported Symptoms:  Pt reports discharge from [facility]pt [facility] hospital on 5/8 after a 6 days; pt shares he sleeps pretty well most of the time; he sleeps from 7p-3am; starts work at 515am.    Risk Assessment: Danger to Self:  No Self-injurious Behavior: No Danger to Others: No Duty to Warn:no Physical Aggression / Violence:No  Access to Firearms a concern: No  Gang Involvement:No  Patient / guardian was educated about steps to take if suicide or homicide risk level increases between visits: n/a While future psychiatric events cannot be accurately predicted, the patient does not currently require acute inpatient psychiatric care and does not currently meet [facility] involuntary commitment criteria.  Substance Abuse History: Current substance abuse: No     Past Psychiatric History:   Previous psychological history is significant for anxiety and depression Outpatient Providers:Brian White, NP at  [facility] History of Psych Hospitalization: Yes ; discharged from [facility] after 6 days; left on 05/23/21 Psychological Testing:  na    Abuse History:  Victim of: No.,  none    Report needed: No. Victim of Neglect:No. Perpetrator of  none   Witness / Exposure to Domestic Violence: No   Protective Services Involvement: No  Witness to [employer] Violence:  No   Family History: No family history on file. Pt grew up in [place]; mom and dad divorced when pt was 1 yo; still has a relationship with his dad  Living situation: the patient lives with their family  Sexual Orientation: Straight  Relationship Status: married  Name of spouse / other:Laurie; married for 26 yrs; dated as teenagers and reconnected in 1996 If a parent, number of children / ages:Mark Hanson 44 yo; lives at home; his is not working and is not is school; he has Archivist: spouse parents  Surveyor, quantity Stress:  No   Income/Employment/Disability: Employment; works for [employer]; [name] works for their former Education officer, environmental watching his kids and works as a Production assistant, radio for a Careers information officer: No   Educational History: Education: Risk manager: Protestant  Any cultural differences that may affect / interfere with treatment:  not applicable   Recreation/Hobbies: Pt enjoys working out and enjoys working with the young people at [employer]; enjoys movies,   Stressors: Other: History of psychiatric issues    Strengths: Supportive Relationships, Family, Church,  and Spirituality  Barriers:  none noted   Legal History: Pending legal issue / charges: The patient has no significant history of legal issues. History of legal issue / charges:  none  Medical History/Surgical History: reviewed Past Medical History:  Diagnosis Date   ADD (attention deficit disorder)     No past surgical history on file.  Medications: Current Outpatient Medications  Medication Sig Dispense Refill    hydrOXYzine (ATARAX) 25 MG tablet Take 1 tablet (25 mg total) by mouth 3 (three) times daily. 90 tablet 3   nortriptyline (PAMELOR) 50 MG capsule TAKE 1 CAPSULE BY MOUTH EVERY DAY 90 capsule 1   traZODone (DESYREL) 50 MG tablet Take 1 tablet (50 mg total) by mouth at bedtime as needed. for sleep 90 tablet 1   No current facility-administered medications for this visit.    Allergies  Allergen Reactions   Penicillins Anaphylaxis    Diagnoses:  Major depressive disorder, recurrent episode, moderate (HCC)  Plan of Care: Encouraged pt to stay engaged with his self care activities and we will meet in 4 wks for a follow up session.   Karie Kirks, [facility]

## 2021-07-22 NOTE — Progress Notes (Signed)
[facility] Med Check  Patient ID: Mark Hanson,  MRN: [id number]  PCP: Chauncy Lean, PA-C  Date of Evaluation: 07/22/2021 Time spent:30 minutes  Chief Complaint:  Chief Complaint   Anxiety; Depression; Follow-up; Medication Refill; OCD     HISTORY/CURRENT STATUS: HPI  Individual Medical History/ Review of Systems: Changes? :No   Allergies: Penicillins  Current Medications:  Current Outpatient Medications:    hydrOXYzine (ATARAX) 25 MG tablet, Take 1 tablet (25 mg total) by mouth 3 (three) times daily., Disp: 90 tablet, Rfl: 3   nortriptyline (PAMELOR) 50 MG capsule, TAKE 1 CAPSULE BY MOUTH EVERY DAY, Disp: 90 capsule, Rfl: 1   traZODone (DESYREL) 50 MG tablet, Take 1 tablet (50 mg total) by mouth at bedtime as needed. for sleep, Disp: 90 tablet, Rfl: 1 Medication Side Effects: none  Family Medical/ Social History: Changes? No  MENTAL HEALTH EXAM:  There were no vitals taken for this visit.There is no height or weight on file to calculate BMI.  General Appearance: Casual  Eye Contact:  Good  Speech:  Clear and Coherent  Volume:  Normal  Mood:  NA  Affect:  Appropriate  Thought Process:  Coherent  Orientation:  Full (Time, Place, and Person)  Thought Content: Logical   Suicidal Thoughts:  No  Homicidal Thoughts:  No  Memory:  WNL  Judgement:  Good  Insight:  Good  Psychomotor Activity:  Normal  Concentration:  Concentration: Good  Recall:  Good  Fund of Knowledge: NA  Language: Good  Assets:  Desire for Improvement  ADL's:  Intact  Cognition: WNL  Prognosis:  Good    DIAGNOSES:    ICD-10-CM   1. Obsessive-compulsive disorder, unspecified type  F42.9 nortriptyline (PAMELOR) 50 MG capsule    2. Insomnia due to other mental disorder  F51.05 traZODone (DESYREL) 50 MG tablet   F99     3. Generalized anxiety disorder  F41.1 nortriptyline (PAMELOR) 50 MG capsule    hydrOXYzine (ATARAX) 25 MG tablet    4. Unspecified mood (affective) disorder (HCC)   F39 nortriptyline (PAMELOR) 50 MG capsule    5. Panic attack  F41.0 nortriptyline (PAMELOR) 50 MG capsule      Receiving Psychotherapy: yes, Maggie Font   RECOMMENDATIONS:  Greater than 50% of  30 min face to face time with patient was spent on counseling and coordination of care. We discussed his continued improvement with anxiety and severe depression with suicidal ideation. He has not had any more SI since his hospitalization for 6 days at [facility]. His job is going well and he is feeling more connected spiritually. He would like to stay on current medications and not make any further changes at this time.   We agreed to: Continue Nortriptyline 50 mg capsule  daily Continue Trazodone 50 mg at bedtime Continue hydroxyzine 25 mg three time daily as needed. To report any worsening symptoms or side effects promptly To follow up in 8 weeks to reassess  Provided emergency contact information including suicide hotline. He understands he can call 911 or go to the nearest emergency dept. He will be seeing Maggie Font today for first therapy session. Discussed potential metabolic side effects associated with atypical antipsychotics, as well as potential risk for movement side effects. Advised pt to contact office if movement side effects occur.       Joan Flores, NP

## 2021-08-19 ENCOUNTER — Ambulatory Visit: Payer: BC Managed Care – PPO | Admitting: Psychology

## 2021-09-26 ENCOUNTER — Ambulatory Visit: Payer: BC Managed Care – PPO | Admitting: Behavioral Health

## 2021-09-30 ENCOUNTER — Ambulatory Visit: Payer: BC Managed Care – PPO | Admitting: Psychology

## 2021-10-12 ENCOUNTER — Ambulatory Visit: Payer: BC Managed Care – PPO | Admitting: Behavioral Health

## 2021-10-12 ENCOUNTER — Encounter: Payer: Self-pay | Admitting: Behavioral Health

## 2021-10-12 DIAGNOSIS — F411 Generalized anxiety disorder: Secondary | ICD-10-CM

## 2021-10-12 DIAGNOSIS — F331 Major depressive disorder, recurrent, moderate: Secondary | ICD-10-CM | POA: Diagnosis not present

## 2021-10-12 NOTE — Progress Notes (Signed)
[facility]  Patient ID: Mark Hanson,  MRN: [id number]  PCP: Iva Lento, PA-C  Date of Evaluation: 10/12/2021 Time spent:30 minutes  Chief Complaint:  Chief Complaint   Depression; Anxiety; Follow-up; Medication Refill; OCD; Patient Education     HISTORY/CURRENT STATUS: HPI  Individual Medical History/ Review of Systems: Changes? :No   Allergies: Penicillins  Current Medications:  Current Outpatient Medications:    hydrOXYzine (ATARAX) 25 MG tablet, Take 1 tablet (25 mg total) by mouth 3 (three) times daily., Disp: 90 tablet, Rfl: 3   nortriptyline (PAMELOR) 50 MG capsule, TAKE 1 CAPSULE BY MOUTH EVERY DAY, Disp: 90 capsule, Rfl: 1   traZODone (DESYREL) 50 MG tablet, Take 1 tablet (50 mg total) by mouth at bedtime as needed. for sleep, Disp: 90 tablet, Rfl: 1 Medication Side Effects: none  Family Medical/ Social History: Changes? No  MENTAL HEALTH EXAM:  There were no vitals taken for this visit.There is no height or weight on file to calculate BMI.  General Appearance: Casual, Neat, and Well Groomed  Eye Contact:  Good  Speech:  Clear and Coherent  Volume:  Normal  Mood:  Anxious, Depressed, and Dysphoric  Affect:  Congruent, Depressed, Flat, and Anxious  Thought Process:  Coherent  Orientation:  Full (Time, Place, and Person)  Thought Content: Logical   Suicidal Thoughts:  No  Homicidal Thoughts:  No  Memory:  WNL  Judgement:  Good  Insight:  Good  Psychomotor Activity:  Normal  Concentration:  Concentration: Good  Recall:  Good  Fund of Knowledge: Good  Language: Good  Assets:  Desire for Improvement  ADL's:  Intact  Cognition: WNL  Prognosis:  Good    DIAGNOSES: No diagnosis found.  Receiving Psychotherapy: No    RECOMMENDATIONS:        Elwanda Brooklyn, NP

## 2021-10-12 NOTE — Progress Notes (Signed)
[facility]  Patient ID: Mark Hanson,  MRN: [id number]  PCP: Chauncy Lean, PA-C  Date of Evaluation: 10/12/2021 Time spent:30 minutes  Chief Complaint:  Chief Complaint   Depression; Anxiety; Follow-up; Medication Refill; OCD; Patient Education     HISTORY/CURRENT STATUS: HPI  51 year old male presents to this office for follow up and medication management. He has very flat subdued affect, but calm and rational. He is seeing new therapist in [facility] now. He is fearful of adjusting medication and says that he cannot do that now. He still has some "bad thoughts". Says that he had fleeting thought if he harmed his wife and son. Says this upset him and he told his wife about this as well as therapist. Says she has been very supportive. He does not want to adjust his medications right now.   Says that he is not suicidal and feels safe at home and work. Says his anxiety is 3/10 and depression is 3/10. He is sleeping 7-8 hours per night. He denies any mania, no psychosis. No SI/HI. He verbally contracted for safety today and understands his resources if needed.    Previous psychiatric medications: Zoloft Clomipramine 100 Paroxetine,  Pristiq- hyper Luvox-one month   Individual Medical History/ Review of Systems: Changes? :No   Allergies: Penicillins  Current Medications:  Current Outpatient Medications:    hydrOXYzine (ATARAX) 25 MG tablet, Take 1 tablet (25 mg total) by mouth 3 (three) times daily., Disp: 90 tablet, Rfl: 3   nortriptyline (PAMELOR) 50 MG capsule, TAKE 1 CAPSULE BY MOUTH EVERY DAY, Disp: 90 capsule, Rfl: 1   traZODone (DESYREL) 50 MG tablet, Take 1 tablet (50 mg total) by mouth at bedtime as needed. for sleep, Disp: 90 tablet, Rfl: 1 Medication Side Effects: none  Family Medical/ Social History: Changes? No  MENTAL HEALTH EXAM:  There were no vitals taken for this visit.There is no height or weight on file to calculate BMI.  General  Appearance: Casual, Neat, and Well Groomed  Eye Contact:  Good  Speech:  Clear and Coherent  Volume:  Normal  Mood:  Anxious, Depressed, and Dysphoric  Affect:  Congruent, Flat, and Anxious  Thought Process:  Coherent  Orientation:  Full (Time, Place, and Person)  Thought Content: Logical   Suicidal Thoughts:  No  Homicidal Thoughts:  No  Memory:  WNL  Judgement:  Fair  Insight:  Good and Fair  Psychomotor Activity:  Normal  Concentration:  Concentration: Fair  Recall:  [employer] of Knowledge: Fair  Language: Fair  Assets:  Desire for Improvement  ADL's:  Intact  Cognition: WNL  Prognosis:  Good    DIAGNOSES:    ICD-10-CM   1. Generalized anxiety disorder  F41.1     2. Major depressive disorder, recurrent episode, moderate (HCC)  F33.1       Receiving Psychotherapy: No    RECOMMENDATIONS:   Greater than 50% of  30 min face to face time with patient was spent on counseling and coordination of care. We discussed his continued improvement with anxiety and severe depression with suicidal ideation. He expresses that he has had some passive thoughts of harm child and wife which I feel is comorbid with his OCD. His wife is aware of these thoughts and has continued to be supportive. He is now in therapy with Brooks Sailors in [place]. I recommended that we might need to increase his Pamelor but he does not want to change meds. He says  he is fearful. I recommended to him to share this discussion  with his therapist. He would like to stay on current medications and not make any further changes at this time.   We agreed to: Continue Nortriptyline 50 mg capsule  daily Continue Trazodone 50 mg at bedtime Continue hydroxyzine 25 mg three time daily as needed. To report any worsening symptoms or side effects promptly To follow up in 8 weeks to reassess  Provided emergency contact information including suicide hotline. He understands he can call 911 or go to the nearest emergency  dept. Continue in therapy with Serena Colonel Discussed potential metabolic side effects associated with atypical antipsychotics, as well as potential risk for movement side effects. Advised pt to contact office if movement side effects occur.         Elwanda Brooklyn, NP

## 2021-12-12 ENCOUNTER — Ambulatory Visit: Payer: BC Managed Care – PPO | Admitting: Behavioral Health

## 2021-12-12 ENCOUNTER — Encounter: Payer: Self-pay | Admitting: Behavioral Health

## 2021-12-12 DIAGNOSIS — F429 Obsessive-compulsive disorder, unspecified: Secondary | ICD-10-CM

## 2021-12-12 DIAGNOSIS — F39 Unspecified mood [affective] disorder: Secondary | ICD-10-CM

## 2021-12-12 DIAGNOSIS — F99 Mental disorder, not otherwise specified: Secondary | ICD-10-CM

## 2021-12-12 DIAGNOSIS — F411 Generalized anxiety disorder: Secondary | ICD-10-CM

## 2021-12-12 DIAGNOSIS — F5105 Insomnia due to other mental disorder: Secondary | ICD-10-CM

## 2021-12-12 DIAGNOSIS — F331 Major depressive disorder, recurrent, moderate: Secondary | ICD-10-CM

## 2021-12-12 DIAGNOSIS — F410 Panic disorder [episodic paroxysmal anxiety] without agoraphobia: Secondary | ICD-10-CM

## 2021-12-12 MED ORDER — TRAZODONE HCL 50 MG PO TABS: 50.0000 mg | ORAL_TABLET | Freq: Every evening | ORAL | 1 refills | Status: DC | PRN

## 2021-12-12 MED ORDER — FLUVOXAMINE MALEATE 100 MG PO TABS: ORAL_TABLET | ORAL | 1 refills | Status: DC

## 2021-12-12 MED ORDER — NORTRIPTYLINE HCL 50 MG PO CAPS: ORAL_CAPSULE | ORAL | 1 refills | Status: DC

## 2021-12-12 MED ORDER — HYDROXYZINE HCL 25 MG PO TABS: 25.0000 mg | ORAL_TABLET | Freq: Three times a day (TID) | ORAL | 3 refills | Status: DC

## 2021-12-12 NOTE — Progress Notes (Signed)
[facility]  Patient ID: Mark Hanson,  MRN: [id number]  PCP: Iva Lento, PA-C  Date of Evaluation: 12/12/2021 Time spent:30 minutes  Chief Complaint:  Chief Complaint   Depression; Anxiety; Follow-up; Medication Problem; Patient Education; Family Problem; OCD       HISTORY/CURRENT STATUS: HPI 51 year old male presents to this office for follow up and medication management. He has very flat subdued affect, but calm and rational. He continues to see his  therapist in [facility] now. He says, "I'm ready to try the new med" and hands me the note with medication name written down last visit.  Says that he spoke to his therapist about it and he feels better about trying medication.  Says things got so bad at home him and his wife were going to divorce but now are working it out after "spiritual awakening".  Says that God has been working in the situation and he wants to try to win his wifes love back.    Says that he is not suicidal and feels safe at home and work. Says his anxiety is 3/10 and depression is 3/10. He is sleeping 7-8 hours per night. He denies any mania, no psychosis. No SI/HI. He verbally contracted for safety today and understands his resources if needed.    Previous psychiatric medications: Zoloft Clomipramine 100 Paroxetine,  Pristiq- hyper Luvox-one month     Individual Medical History/ Review of Systems: Changes? :No   Allergies: Penicillins  Current Medications:  Current Outpatient Medications:    fluvoxaMINE (LUVOX) 100 MG tablet, Take 1/2 tablet 50 mg by mouth daily at bedtime for 7 days, then take one whole tablet 100 mg total daily at bedtime., Disp: 30 tablet, Rfl: 1   hydrOXYzine (ATARAX) 25 MG tablet, Take 1 tablet (25 mg total) by mouth 3 (three) times daily., Disp: 90 tablet, Rfl: 3   nortriptyline (PAMELOR) 50 MG capsule, TAKE 1 CAPSULE BY MOUTH EVERY DAY, Disp: 90 capsule, Rfl: 1   traZODone (DESYREL) 50 MG tablet, Take 1 tablet  (50 mg total) by mouth at bedtime as needed. for sleep, Disp: 90 tablet, Rfl: 1 Medication Side Effects: none  Family Medical/ Social History: Changes? No  MENTAL HEALTH EXAM:  There were no vitals taken for this visit.There is no height or weight on file to calculate BMI.  General Appearance: Casual, Neat, and Well Groomed  Eye Contact:  Good  Speech:  Clear and Coherent  Volume:  Normal  Mood:  Anxious and Depressed  Affect:  Appropriate  Thought Process:  Coherent  Orientation:  Full (Time, Place, and Person)  Thought Content: Logical   Suicidal Thoughts:  No  Homicidal Thoughts:  No  Memory:  WNL  Judgement:  Good  Insight:  Good  Psychomotor Activity:  Normal  Concentration:  Concentration: Good  Recall:  Good  Fund of Knowledge: Good  Language: Good  Assets:  Desire for Improvement  ADL's:  Intact  Cognition: WNL  Prognosis:  Good    DIAGNOSES:    ICD-10-CM   1. Obsessive-compulsive disorder, unspecified type  F42.9 fluvoxaMINE (LUVOX) 100 MG tablet    nortriptyline (PAMELOR) 50 MG capsule    2. Generalized anxiety disorder  F41.1 fluvoxaMINE (LUVOX) 100 MG tablet    hydrOXYzine (ATARAX) 25 MG tablet    nortriptyline (PAMELOR) 50 MG capsule    3. Major depressive disorder, recurrent episode, moderate (HCC)  F33.1 fluvoxaMINE (LUVOX) 100 MG tablet    4. Insomnia due to other mental  disorder  F51.05 traZODone (DESYREL) 50 MG tablet   F99     5. Unspecified mood (affective) disorder (HCC)  F39 nortriptyline (PAMELOR) 50 MG capsule    6. Panic attack  F41.0 nortriptyline (PAMELOR) 50 MG capsule      Receiving Psychotherapy: No    RECOMMENDATIONS:   Greater than 50% of  30 min face to face time with patient was spent on counseling and coordination of care. We discussed his continued improvement with anxiety and severe depression with suicidal ideation.  He is still struggling with hight and lows. He is getting fixated on problems at home. He recently was  planning divorce but is now getting spiritual counseling. He believes things are improving at home.  He is now in therapy with Brooks Sailors in [facility].  Says that he would now like to try Luvox for OCD. Says that he commits to being compliant with medication.   We agreed to: Continue Nortriptyline 50 mg capsule  daily Continue Trazodone 50 mg at bedtime Continue hydroxyzine 25 mg three time daily as needed. To Start Luvox 50 mg for 7 days, then 100 mg daily. To report any worsening symptoms or side effects promptly To follow up in 8 weeks to reassess  Provided emergency contact information including suicide hotline. He understands he can call 911 or go to the nearest emergency dept. Continue in therapy with Brooks Sailors Discussed potential metabolic side effects associated with atypical antipsychotics, as well as potential risk for movement side effects. Advised pt to contact office if movement side effects occur.                Joan Flores, NP

## 2022-01-04 ENCOUNTER — Other Ambulatory Visit: Payer: Self-pay | Admitting: Behavioral Health

## 2022-01-04 DIAGNOSIS — F331 Major depressive disorder, recurrent, moderate: Secondary | ICD-10-CM

## 2022-01-04 DIAGNOSIS — F411 Generalized anxiety disorder: Secondary | ICD-10-CM

## 2022-01-04 DIAGNOSIS — F429 Obsessive-compulsive disorder, unspecified: Secondary | ICD-10-CM

## 2022-01-23 ENCOUNTER — Ambulatory Visit: Payer: BC Managed Care – PPO | Admitting: Behavioral Health

## 2022-02-02 ENCOUNTER — Ambulatory Visit: Payer: BC Managed Care – PPO | Admitting: Behavioral Health

## 2022-02-04 ENCOUNTER — Other Ambulatory Visit: Payer: Self-pay | Admitting: Behavioral Health

## 2022-02-04 DIAGNOSIS — F411 Generalized anxiety disorder: Secondary | ICD-10-CM

## 2022-02-23 ENCOUNTER — Ambulatory Visit: Payer: BC Managed Care – PPO | Admitting: Behavioral Health

## 2022-02-27 ENCOUNTER — Ambulatory Visit: Payer: BC Managed Care – PPO | Admitting: Behavioral Health

## 2022-03-07 ENCOUNTER — Ambulatory Visit: Payer: BC Managed Care – PPO | Admitting: Behavioral Health

## 2022-03-07 ENCOUNTER — Encounter: Payer: Self-pay | Admitting: Behavioral Health

## 2022-03-07 DIAGNOSIS — F331 Major depressive disorder, recurrent, moderate: Secondary | ICD-10-CM

## 2022-03-07 DIAGNOSIS — F411 Generalized anxiety disorder: Secondary | ICD-10-CM | POA: Diagnosis not present

## 2022-03-07 DIAGNOSIS — F429 Obsessive-compulsive disorder, unspecified: Secondary | ICD-10-CM

## 2022-03-07 MED ORDER — FLUVOXAMINE MALEATE 100 MG PO TABS: 150.0000 mg | ORAL_TABLET | Freq: Every day | ORAL | 1 refills | Status: DC

## 2022-03-07 MED ORDER — HYDROXYZINE HCL 25 MG PO TABS: 25.0000 mg | ORAL_TABLET | Freq: Three times a day (TID) | ORAL | 1 refills | Status: DC

## 2022-03-07 NOTE — Progress Notes (Signed)
[facility]  Patient ID: Mark Hanson,  MRN: [id number]  PCP: Iva Lento, PA-C  Date of Evaluation: 03/07/2022 Time spent:30 minutes  Chief Complaint:  Chief Complaint   Anxiety; Family Problem; Medication Refill; Patient Education; Follow-up; OCD     HISTORY/CURRENT STATUS: HPI  52 year old male presents to this office for follow up and medication management.  Says that so far the Luvox has been helping and he is happy with the medication. Says that he has been less fixated and is able to think clearer. Says his wife believes he is getting better. He does feel that he could use a slight increase with the Luvox. Says that he is not suicidal and feels safe at home and work. Says his anxiety is 2/10 and depression is 2/10. He is sleeping 7-8 hours per night. He denies any mania, no psychosis. No SI/HI. He verbally contracted for safety today and understands his resources if needed.    Previous psychiatric medications: Zoloft Clomipramine 100 Paroxetine,  Pristiq- hyper Luvox-one month       Individual Medical History/ Review of Systems: Changes? :No   Allergies: Penicillins  Current Medications:  Current Outpatient Medications:    fluvoxaMINE (LUVOX) 100 MG tablet, Take 1.5 tablets (150 mg total) by mouth at bedtime., Disp: 135 tablet, Rfl: 1   hydrOXYzine (ATARAX) 25 MG tablet, Take 1 tablet (25 mg total) by mouth 3 (three) times daily., Disp: 270 tablet, Rfl: 1   nortriptyline (PAMELOR) 50 MG capsule, TAKE 1 CAPSULE BY MOUTH EVERY DAY, Disp: 90 capsule, Rfl: 1   traZODone (DESYREL) 50 MG tablet, Take 1 tablet (50 mg total) by mouth at bedtime as needed. for sleep, Disp: 90 tablet, Rfl: 1 Medication Side Effects: none  Family Medical/ Social History: Changes? No  MENTAL HEALTH EXAM:  There were no vitals taken for this visit.There is no height or weight on file to calculate BMI.  General Appearance: Casual, Neat, and Well Groomed  Eye Contact:   Good  Speech:  Clear and Coherent  Volume:  Normal  Mood:  Anxious  Affect:  Appropriate  Thought Process:  Coherent  Orientation:  Full (Time, Place, and Person)  Thought Content: Rumination   Suicidal Thoughts:  No  Homicidal Thoughts:  No  Memory:  WNL  Judgement:  Good  Insight:  Good  Psychomotor Activity:  Normal  Concentration:  Concentration: Good  Recall:  Good  Fund of Knowledge: Good  Language: Good  Assets:  Desire for Improvement  ADL's:  Intact  Cognition: WNL  Prognosis:  Good    DIAGNOSES:    ICD-10-CM   1. Major depressive disorder, recurrent episode, moderate (HCC)  F33.1 fluvoxaMINE (LUVOX) 100 MG tablet    2. Generalized anxiety disorder  F41.1 fluvoxaMINE (LUVOX) 100 MG tablet    hydrOXYzine (ATARAX) 25 MG tablet    3. Obsessive-compulsive disorder, unspecified type  F42.9 fluvoxaMINE (LUVOX) 100 MG tablet      Receiving Psychotherapy: No    RECOMMENDATIONS:    Greater than 50% of  30 min face to face time with patient was spent on counseling and coordination of care. We discussed his recent report of significant improvement. Says his wife concurs. He has been doing better with not fixating on ruminating on problems or his shortcoming recently.  We discussed his request of slightly increasing his Luvox this visit.  Says that his relationship with wife is now improving.  He is now in therapy with Serena Colonel in [facility].  Says that he commits to being compliant with medication.   We agreed to: Continue Nortriptyline 50 mg capsule  daily Continue Trazodone 50 mg at bedtime Continue hydroxyzine 25 mg three time daily as needed. Increase Luvox to 150 mg daily To report any worsening symptoms or side effects promptly To follow up in 8 weeks to reassess  Provided emergency contact information including suicide hotline. He understands he can call 911 or go to the nearest emergency dept. Continue in therapy with Serena Colonel Discussed potential  metabolic side effects associated with atypical antipsychotics, as well as potential risk for movement side effects. Advised pt to contact office if movement side effects occur.            Elwanda Brooklyn, NP

## 2022-03-10 ENCOUNTER — Ambulatory Visit: Payer: BC Managed Care – PPO | Admitting: Behavioral Health

## 2022-05-08 ENCOUNTER — Ambulatory Visit (INDEPENDENT_AMBULATORY_CARE_PROVIDER_SITE_OTHER): Payer: BC Managed Care – PPO | Admitting: Behavioral Health

## 2022-05-08 ENCOUNTER — Encounter: Payer: Self-pay | Admitting: Behavioral Health

## 2022-05-08 DIAGNOSIS — F429 Obsessive-compulsive disorder, unspecified: Secondary | ICD-10-CM

## 2022-05-08 DIAGNOSIS — F411 Generalized anxiety disorder: Secondary | ICD-10-CM

## 2022-05-08 DIAGNOSIS — F330 Major depressive disorder, recurrent, mild: Secondary | ICD-10-CM

## 2022-05-08 NOTE — Progress Notes (Signed)
[facility]  Patient ID: Angie Hogg,  MRN: [id number]  PCP: Chauncy Lean, PA-C  Date of Evaluation: 05/08/2022 Time spent:30 minutes  Chief Complaint:  Chief Complaint   Anxiety; Depression; Follow-up; Medication Refill; Patient Education     HISTORY/CURRENT STATUS: HPI  52 year old male presents to this office for follow up and medication management.  Says that so far the Luvox has been helping and he is happy with the medication. Says that he has been less fixated and is able to think clearer. Says his wife believes he is getting better.  He is requesting no medication changes this visit. Says his anxiety is 2/10 and depression is 2/10. He is sleeping 7-8 hours per night. He denies any mania, no psychosis. No SI/HI. He verbally contracted for safety today and understands his resources if needed.    Previous psychiatric medications: Zoloft Clomipramine 100 Paroxetine,  Pristiq- hyper Luvox-one month      Individual Medical History/ Review of Systems: Changes? :No   Allergies: Penicillins  Current Medications:  Current Outpatient Medications:    fluvoxaMINE (LUVOX) 100 MG tablet, Take 1.5 tablets (150 mg total) by mouth at bedtime., Disp: 135 tablet, Rfl: 1   hydrOXYzine (ATARAX) 25 MG tablet, Take 1 tablet (25 mg total) by mouth 3 (three) times daily., Disp: 270 tablet, Rfl: 1   nortriptyline (PAMELOR) 50 MG capsule, TAKE 1 CAPSULE BY MOUTH EVERY DAY, Disp: 90 capsule, Rfl: 1   traZODone (DESYREL) 50 MG tablet, Take 1 tablet (50 mg total) by mouth at bedtime as needed. for sleep, Disp: 90 tablet, Rfl: 1 Medication Side Effects: none  Family Medical/ Social History: Changes? No  MENTAL HEALTH EXAM:  There were no vitals taken for this visit.There is no height or weight on file to calculate BMI.  General Appearance: Casual, Neat, and Well Groomed  Eye Contact:  Good  Speech:  Clear and Coherent  Volume:  Normal  Mood:  Anxious, Depressed, and  Dysphoric  Affect:  Appropriate  Thought Process:  Coherent  Orientation:  Full (Time, Place, and Person)  Thought Content: Logical   Suicidal Thoughts:  No  Homicidal Thoughts:  No  Memory:  WNL  Judgement:  Good  Insight:  Good  Psychomotor Activity:  Normal  Concentration:  Concentration: Good  Recall:  Good  Fund of Knowledge: Good  Language: Good  Assets:  Desire for Improvement  ADL's:  Intact  Cognition: WNL  Prognosis:  Good    DIAGNOSES: No diagnosis found.  Receiving Psychotherapy: No    RECOMMENDATIONS:   Greater than 50% of  30 min face to face time with patient was spent on counseling and coordination of care. We discussed his recent report of significant improvement. Says his wife concurs. He has been doing better with not fixating on ruminating on problems or his shortcoming recently. He agrees that things have been much better with his wife.  Requesting no medication changes this visit. He is now in therapy with Brooks Sailors in [facility].  Says that he commits to being compliant with medication.   We agreed to: Continue Nortriptyline 50 mg capsule  daily Continue Trazodone 50 mg at bedtime Continue hydroxyzine 25 mg three time daily as needed. Increase Luvox to 150 mg daily To report any worsening symptoms or side effects promptly To follow up in 12 weeks to reassess  Provided emergency contact information including suicide hotline. He understands he can call 911 or go to the nearest emergency dept. Continue  in therapy with Brooks Sailors Discussed potential metabolic side effects associated with atypical antipsychotics, as well as potential risk for movement side effects. Advised pt to contact office if movement side effects occur.      Joan Flores, NP

## 2022-07-03 ENCOUNTER — Other Ambulatory Visit: Payer: Self-pay | Admitting: Behavioral Health

## 2022-07-03 DIAGNOSIS — F39 Unspecified mood [affective] disorder: Secondary | ICD-10-CM

## 2022-07-03 DIAGNOSIS — F410 Panic disorder [episodic paroxysmal anxiety] without agoraphobia: Secondary | ICD-10-CM

## 2022-07-03 DIAGNOSIS — F429 Obsessive-compulsive disorder, unspecified: Secondary | ICD-10-CM

## 2022-07-03 DIAGNOSIS — F5105 Insomnia due to other mental disorder: Secondary | ICD-10-CM

## 2022-07-03 DIAGNOSIS — F411 Generalized anxiety disorder: Secondary | ICD-10-CM

## 2022-07-29 ENCOUNTER — Other Ambulatory Visit: Payer: Self-pay | Admitting: Behavioral Health

## 2022-07-29 DIAGNOSIS — F410 Panic disorder [episodic paroxysmal anxiety] without agoraphobia: Secondary | ICD-10-CM

## 2022-07-29 DIAGNOSIS — F411 Generalized anxiety disorder: Secondary | ICD-10-CM

## 2022-07-29 DIAGNOSIS — F5105 Insomnia due to other mental disorder: Secondary | ICD-10-CM

## 2022-07-29 DIAGNOSIS — F429 Obsessive-compulsive disorder, unspecified: Secondary | ICD-10-CM

## 2022-07-29 DIAGNOSIS — F39 Unspecified mood [affective] disorder: Secondary | ICD-10-CM

## 2022-08-14 ENCOUNTER — Ambulatory Visit: Payer: BC Managed Care – PPO | Admitting: Behavioral Health

## 2022-08-30 ENCOUNTER — Other Ambulatory Visit: Payer: Self-pay | Admitting: Behavioral Health

## 2022-08-30 DIAGNOSIS — F331 Major depressive disorder, recurrent, moderate: Secondary | ICD-10-CM

## 2022-08-30 DIAGNOSIS — F5105 Insomnia due to other mental disorder: Secondary | ICD-10-CM

## 2022-08-30 DIAGNOSIS — F411 Generalized anxiety disorder: Secondary | ICD-10-CM

## 2022-08-30 DIAGNOSIS — F410 Panic disorder [episodic paroxysmal anxiety] without agoraphobia: Secondary | ICD-10-CM

## 2022-08-30 DIAGNOSIS — F429 Obsessive-compulsive disorder, unspecified: Secondary | ICD-10-CM

## 2022-08-30 DIAGNOSIS — F39 Unspecified mood [affective] disorder: Secondary | ICD-10-CM

## 2022-09-07 ENCOUNTER — Encounter: Payer: Self-pay | Admitting: Behavioral Health

## 2022-09-07 ENCOUNTER — Ambulatory Visit: Payer: BC Managed Care – PPO | Admitting: Behavioral Health

## 2022-09-07 DIAGNOSIS — F410 Panic disorder [episodic paroxysmal anxiety] without agoraphobia: Secondary | ICD-10-CM

## 2022-09-07 DIAGNOSIS — F5105 Insomnia due to other mental disorder: Secondary | ICD-10-CM

## 2022-09-07 DIAGNOSIS — F411 Generalized anxiety disorder: Secondary | ICD-10-CM | POA: Diagnosis not present

## 2022-09-07 DIAGNOSIS — F39 Unspecified mood [affective] disorder: Secondary | ICD-10-CM

## 2022-09-07 DIAGNOSIS — F429 Obsessive-compulsive disorder, unspecified: Secondary | ICD-10-CM

## 2022-09-07 DIAGNOSIS — F331 Major depressive disorder, recurrent, moderate: Secondary | ICD-10-CM

## 2022-09-07 DIAGNOSIS — F99 Mental disorder, not otherwise specified: Secondary | ICD-10-CM

## 2022-09-07 MED ORDER — HYDROXYZINE HCL 25 MG PO TABS: 25.0000 mg | ORAL_TABLET | Freq: Three times a day (TID) | ORAL | 1 refills | Status: DC

## 2022-09-07 MED ORDER — FLUVOXAMINE MALEATE 100 MG PO TABS: 150.0000 mg | ORAL_TABLET | Freq: Every day | ORAL | 1 refills | Status: DC

## 2022-09-07 MED ORDER — NORTRIPTYLINE HCL 50 MG PO CAPS: ORAL_CAPSULE | ORAL | 5 refills | Status: DC

## 2022-09-07 MED ORDER — TRAZODONE HCL 50 MG PO TABS: 50.0000 mg | ORAL_TABLET | Freq: Every evening | ORAL | 1 refills | Status: DC | PRN

## 2022-09-07 NOTE — Progress Notes (Signed)
[facility]  Patient ID: Napolean Sidman,  MRN: [id number]  PCP: Chauncy Lean, PA-C  Date of Evaluation: 09/07/2022 Time spent:20 minutes  Chief Complaint:  Chief Complaint   Depression; Anxiety; Follow-up; Medication Refill; Patient Education; OCD     HISTORY/CURRENT STATUS: HPI  52 year old male presents to this office for follow up and medication management.  Continues to enjoy a great period of stability. He is very involved in church. Says relationship with spouse has improved. Says that he has been less fixated and is able to think clearer.  He is requesting no medication changes this visit. Says his anxiety is 2/10 and depression is 2/10. He is sleeping 7-8 hours per night. He denies any mania, no psychosis. No SI/HI. He verbally contracted for safety today and understands his resources if needed.    Previous psychiatric medications: Zoloft Clomipramine 100 Paroxetine,  Pristiq- hyper Luvox-one month     Individual Medical History/ Review of Systems: Changes? :No   Allergies: Penicillins  Current Medications:  Current Outpatient Medications:    fluvoxaMINE (LUVOX) 100 MG tablet, Take 1.5 tablets (150 mg total) by mouth at bedtime., Disp: 135 tablet, Rfl: 1   hydrOXYzine (ATARAX) 25 MG tablet, Take 1 tablet (25 mg total) by mouth 3 (three) times daily., Disp: 270 tablet, Rfl: 1   nortriptyline (PAMELOR) 50 MG capsule, TAKE 1 CAPSULE BY MOUTH EVERY DAY, Disp: 30 capsule, Rfl: 5   traZODone (DESYREL) 50 MG tablet, Take 1 tablet (50 mg total) by mouth at bedtime as needed. for sleep, Disp: 90 tablet, Rfl: 1 Medication Side Effects: none  Family Medical/ Social History: Changes? No  MENTAL HEALTH EXAM:  There were no vitals taken for this visit.There is no height or weight on file to calculate BMI.  General Appearance: Casual, Neat, and Well Groomed  Eye Contact:  Good  Speech:  Clear and Coherent  Volume:  Normal  Mood:  NA  Affect:  Appropriate   Thought Process:  Coherent  Orientation:  Full (Time, Place, and Person)  Thought Content: Logical   Suicidal Thoughts:  No  Homicidal Thoughts:  No  Memory:  WNL  Judgement:  Good  Insight:  Good  Psychomotor Activity:  Normal  Concentration:  Concentration: Good  Recall:  Good  Fund of Knowledge: Good  Language: Good  Assets:  Desire for Improvement  ADL's:  Intact  Cognition: WNL  Prognosis:  Good    DIAGNOSES:    ICD-10-CM   1. Generalized anxiety disorder  F41.1 nortriptyline (PAMELOR) 50 MG capsule    hydrOXYzine (ATARAX) 25 MG tablet    fluvoxaMINE (LUVOX) 100 MG tablet    2. Obsessive-compulsive disorder, unspecified type  F42.9 nortriptyline (PAMELOR) 50 MG capsule    fluvoxaMINE (LUVOX) 100 MG tablet    3. Unspecified mood (affective) disorder (HCC)  F39 nortriptyline (PAMELOR) 50 MG capsule    4. Panic attack  F41.0 nortriptyline (PAMELOR) 50 MG capsule    5. Insomnia due to other mental disorder  F51.05 traZODone (DESYREL) 50 MG tablet   F99     6. Major depressive disorder, recurrent episode, moderate (HCC)  F33.1 fluvoxaMINE (LUVOX) 100 MG tablet      Receiving Psychotherapy: No    RECOMMENDATIONS:   Greater than 50% of  30 min face to face time with patient was spent on counseling and coordination of care. We discussed his recent report of significant improvement and continue stability.  Requesting no medication changes this visit. He is now  in therapy with Brooks Sailors in [facility].  Says that he commits to being compliant with medication.   We agreed to: Continue Nortriptyline 50 mg capsule  daily Continue Trazodone 50 mg at bedtime Continue hydroxyzine 25 mg three time daily as needed. Increase Luvox to 150 mg daily To report any worsening symptoms or side effects promptly To follow up in 6 months to reassess  Provided emergency contact information including suicide hotline. He understands he can call 911 or go to the nearest emergency  dept. Continue in therapy with Brooks Sailors Discussed potential metabolic side effects associated with atypical antipsychotics, as well as potential risk for movement side effects. Advised pt to contact office if movement side effects occur.        Joan Flores, NP

## 2023-03-05 ENCOUNTER — Ambulatory Visit: Payer: BC Managed Care – PPO | Admitting: Behavioral Health

## 2023-03-23 ENCOUNTER — Ambulatory Visit: Payer: BC Managed Care – PPO | Admitting: Behavioral Health

## 2023-03-27 ENCOUNTER — Other Ambulatory Visit: Payer: Self-pay | Admitting: Behavioral Health

## 2023-03-27 DIAGNOSIS — F411 Generalized anxiety disorder: Secondary | ICD-10-CM

## 2023-03-27 DIAGNOSIS — F5105 Insomnia due to other mental disorder: Secondary | ICD-10-CM

## 2023-03-27 DIAGNOSIS — F429 Obsessive-compulsive disorder, unspecified: Secondary | ICD-10-CM

## 2023-03-27 DIAGNOSIS — F39 Unspecified mood [affective] disorder: Secondary | ICD-10-CM

## 2023-03-27 DIAGNOSIS — F410 Panic disorder [episodic paroxysmal anxiety] without agoraphobia: Secondary | ICD-10-CM

## 2023-03-27 DIAGNOSIS — F331 Major depressive disorder, recurrent, moderate: Secondary | ICD-10-CM

## 2023-04-12 ENCOUNTER — Ambulatory Visit: Admitting: Behavioral Health

## 2023-04-12 ENCOUNTER — Encounter: Payer: Self-pay | Admitting: Behavioral Health

## 2023-04-12 DIAGNOSIS — F99 Mental disorder, not otherwise specified: Secondary | ICD-10-CM

## 2023-04-12 DIAGNOSIS — F411 Generalized anxiety disorder: Secondary | ICD-10-CM

## 2023-04-12 DIAGNOSIS — F410 Panic disorder [episodic paroxysmal anxiety] without agoraphobia: Secondary | ICD-10-CM | POA: Diagnosis not present

## 2023-04-12 DIAGNOSIS — F39 Unspecified mood [affective] disorder: Secondary | ICD-10-CM | POA: Diagnosis not present

## 2023-04-12 DIAGNOSIS — F5105 Insomnia due to other mental disorder: Secondary | ICD-10-CM

## 2023-04-12 DIAGNOSIS — F429 Obsessive-compulsive disorder, unspecified: Secondary | ICD-10-CM | POA: Diagnosis not present

## 2023-04-12 DIAGNOSIS — F331 Major depressive disorder, recurrent, moderate: Secondary | ICD-10-CM

## 2023-04-12 MED ORDER — NORTRIPTYLINE HCL 50 MG PO CAPS: 50.0000 mg | ORAL_CAPSULE | Freq: Every day | ORAL | 1 refills | Status: DC

## 2023-04-12 MED ORDER — TRAZODONE HCL 50 MG PO TABS
ORAL_TABLET | ORAL | 1 refills | Status: DC
Start: 2023-04-12 — End: 2023-10-14

## 2023-04-12 MED ORDER — FLUVOXAMINE MALEATE 100 MG PO TABS: 150.0000 mg | ORAL_TABLET | Freq: Every day | ORAL | 1 refills | Status: DC

## 2023-04-12 MED ORDER — HYDROXYZINE HCL 25 MG PO TABS: 25.0000 mg | ORAL_TABLET | Freq: Three times a day (TID) | ORAL | 0 refills | Status: DC

## 2023-04-12 NOTE — Progress Notes (Signed)
 [facility]  Patient ID: Mark Hanson,  MRN: [id number]  PCP: Chauncy Lean, PA-C  Date of Evaluation: 04/12/2023 Time spent:30 minutes  Chief Complaint:   HISTORY/CURRENT STATUS: HPI 53 year old male presents to this office for follow up and medication management.  No psychosocial changes this visit. He reports having gastric issues that have him concerned. Scheduled for endoscopy. Continues to enjoy a great period of stability. He is very involved in church. Says relationship with spouse has improved. Happy with his psychotropic medications.   He is requesting no medication changes this visit. Says his anxiety is 2/10 and depression is 2/10. He is sleeping 7-8 hours per night. He denies any mania, no psychosis. No SI/HI. He verbally contracted for safety today and understands his resources if needed.    Previous psychiatric medications: Zoloft Clomipramine 100 Paroxetine,  Pristiq- hyper Luvox-one month    Individual Medical History/ Review of Systems: Changes? :No   Allergies: Penicillins  Current Medications:  Current Outpatient Medications:    fluvoxaMINE (LUVOX) 100 MG tablet, Take 1.5 tablets (150 mg total) by mouth at bedtime., Disp: 135 tablet, Rfl: 1   hydrOXYzine (ATARAX) 25 MG tablet, Take 1 tablet (25 mg total) by mouth 3 (three) times daily., Disp: 270 tablet, Rfl: 0   nortriptyline (PAMELOR) 50 MG capsule, Take 1 capsule (50 mg total) by mouth daily., Disp: 90 capsule, Rfl: 1   traZODone (DESYREL) 50 MG tablet, Take 1-1.5 tablets by mouth daily at bedtime for sleep, Disp: 135 tablet, Rfl: 1 Medication Side Effects: none  Family Medical/ Social History: Changes? No  MENTAL HEALTH EXAM:  There were no vitals taken for this visit.There is no height or weight on file to calculate BMI.  General Appearance: Casual, Neat, and Well Groomed  Eye Contact:  NA  Speech:  Clear and Coherent  Volume:  Normal  Mood:  NA  Affect:  Appropriate  Thought  Process:  Coherent  Orientation:  Full (Time, Place, and Person)  Thought Content: Logical   Suicidal Thoughts:  No  Homicidal Thoughts:  No  Memory:  WNL  Judgement:  Good  Insight:  Good  Psychomotor Activity:  Normal  Concentration:  Concentration: Good  Recall:  Good  Fund of Knowledge: Good  Language: Good  Assets:  Desire for Improvement  ADL's:  Intact  Cognition: WNL  Prognosis:  Good    DIAGNOSES:    ICD-10-CM   1. Generalized anxiety disorder  F41.1 nortriptyline (PAMELOR) 50 MG capsule    fluvoxaMINE (LUVOX) 100 MG tablet    hydrOXYzine (ATARAX) 25 MG tablet    2. Obsessive-compulsive disorder, unspecified type  F42.9 nortriptyline (PAMELOR) 50 MG capsule    fluvoxaMINE (LUVOX) 100 MG tablet    3. Unspecified mood (affective) disorder (HCC)  F39 nortriptyline (PAMELOR) 50 MG capsule    4. Panic attack  F41.0 nortriptyline (PAMELOR) 50 MG capsule    5. Insomnia due to other mental disorder  F51.05 traZODone (DESYREL) 50 MG tablet   F99     6. Major depressive disorder, recurrent episode, moderate (HCC)  F33.1 fluvoxaMINE (LUVOX) 100 MG tablet      Receiving Psychotherapy: No    RECOMMENDATIONS:  Greater than 50% of  30 min face to face time with patient was spent on counseling and coordination of care. We discussed his recent report of significant improvement and continue stability.  Talked about his gastric issues worrying him. He has had problems with diarrhea. Following up with PCP regularly. Requesting  no medication changes this visit. He is now in therapy with Brooks Sailors in [facility].  Says that he commits to being compliant with medication.   We agreed to: Continue Nortriptyline 50 mg capsule  daily Continue Trazodone 50 mg at bedtime Continue hydroxyzine 25 mg three time daily as needed. Continue Luvox to 150 mg daily To report any worsening symptoms or side effects promptly To follow up in 6 months to reassess  Provided emergency contact  information including suicide hotline. He understands he can call 911 or go to the nearest emergency dept. Continue in therapy with Brooks Sailors Discussed potential metabolic side effects associated with atypical antipsychotics, as well as potential risk for movement side effects. Advised pt to contact office if movement side effects occur.        Joan Flores, NP

## 2023-09-22 ENCOUNTER — Other Ambulatory Visit: Payer: Self-pay | Admitting: Behavioral Health

## 2023-09-22 DIAGNOSIS — F411 Generalized anxiety disorder: Secondary | ICD-10-CM

## 2023-10-08 ENCOUNTER — Ambulatory Visit: Admitting: Behavioral Health

## 2023-10-13 ENCOUNTER — Other Ambulatory Visit: Payer: Self-pay | Admitting: Behavioral Health

## 2023-10-13 DIAGNOSIS — F99 Mental disorder, not otherwise specified: Secondary | ICD-10-CM

## 2023-10-17 ENCOUNTER — Other Ambulatory Visit: Payer: Self-pay | Admitting: Behavioral Health

## 2023-10-17 DIAGNOSIS — F411 Generalized anxiety disorder: Secondary | ICD-10-CM

## 2023-10-20 ENCOUNTER — Other Ambulatory Visit: Payer: Self-pay | Admitting: Behavioral Health

## 2023-10-20 DIAGNOSIS — F410 Panic disorder [episodic paroxysmal anxiety] without agoraphobia: Secondary | ICD-10-CM

## 2023-10-20 DIAGNOSIS — F429 Obsessive-compulsive disorder, unspecified: Secondary | ICD-10-CM

## 2023-10-20 DIAGNOSIS — F39 Unspecified mood [affective] disorder: Secondary | ICD-10-CM

## 2023-10-20 DIAGNOSIS — F411 Generalized anxiety disorder: Secondary | ICD-10-CM

## 2023-10-24 ENCOUNTER — Encounter: Payer: Self-pay | Admitting: Behavioral Health

## 2023-10-24 ENCOUNTER — Ambulatory Visit: Admitting: Behavioral Health

## 2023-10-24 DIAGNOSIS — F410 Panic disorder [episodic paroxysmal anxiety] without agoraphobia: Secondary | ICD-10-CM

## 2023-10-24 DIAGNOSIS — F39 Unspecified mood [affective] disorder: Secondary | ICD-10-CM

## 2023-10-24 DIAGNOSIS — F5105 Insomnia due to other mental disorder: Secondary | ICD-10-CM

## 2023-10-24 DIAGNOSIS — F331 Major depressive disorder, recurrent, moderate: Secondary | ICD-10-CM

## 2023-10-24 DIAGNOSIS — F411 Generalized anxiety disorder: Secondary | ICD-10-CM | POA: Diagnosis not present

## 2023-10-24 DIAGNOSIS — F99 Mental disorder, not otherwise specified: Secondary | ICD-10-CM

## 2023-10-24 DIAGNOSIS — F429 Obsessive-compulsive disorder, unspecified: Secondary | ICD-10-CM | POA: Diagnosis not present

## 2023-10-24 MED ORDER — NORTRIPTYLINE HCL 50 MG PO CAPS
50.0000 mg | ORAL_CAPSULE | Freq: Every day | ORAL | 1 refills | Status: AC
Start: 2023-10-24 — End: ?

## 2023-10-24 MED ORDER — TRAZODONE HCL 50 MG PO TABS: ORAL_TABLET | ORAL | 0 refills | Status: DC

## 2023-10-24 MED ORDER — FLUVOXAMINE MALEATE 100 MG PO TABS: 200.0000 mg | ORAL_TABLET | Freq: Every day | ORAL | 1 refills | Status: AC

## 2023-10-24 MED ORDER — HYDROXYZINE HCL 25 MG PO TABS: 25.0000 mg | ORAL_TABLET | Freq: Three times a day (TID) | ORAL | 0 refills | Status: DC

## 2023-10-24 NOTE — Progress Notes (Signed)
 [facility]  Patient ID: Mark Hanson,  MRN: [id number]  PCP: Neita Dover, PA-C  Date of Evaluation: 10/24/2023 Time spent:30 minutes  Chief Complaint:  Chief Complaint   Anxiety; Depression; Follow-up; Medication Refill; Patient Education; OCD     HISTORY/CURRENT STATUS: HPI  53 year old male presents to this office for follow up and medication management.  No psychosocial changes this visit.  Scheduled for colonoscopy.  Continues to enjoy a great period of stability. He is very involved in church. Says relationship with spouse has improved. Happy with his psychotropic medications. Says his anxiety is 2/10 and depression is 2/10. He is sleeping 7-8 hours per night. He denies any mania, no psychosis. No SI/HI. He verbally contracted for safety today and understands his resources if needed.    Previous psychiatric medications: Zoloft  Clomipramine 100 Paroxetine,  Pristiq- hyper Luvox -one month      Individual Medical History/ Review of Systems: Changes? :No   Allergies: Penicillins  Current Medications:  Current Outpatient Medications:    fluvoxaMINE  (LUVOX ) 100 MG tablet, Take 2 tablets (200 mg total) by mouth at bedtime., Disp: 180 tablet, Rfl: 1   hydrOXYzine  (ATARAX ) 25 MG tablet, Take 1 tablet (25 mg total) by mouth 3 (three) times daily., Disp: 270 tablet, Rfl: 0   nortriptyline  (PAMELOR ) 50 MG capsule, Take 1 capsule (50 mg total) by mouth daily., Disp: 90 capsule, Rfl: 1   traZODone  (DESYREL ) 50 MG tablet, Take 1-1.5 tablets by mouth daily at bedtime for sleep, Disp: 45 tablet, Rfl: 0 Medication Side Effects: none  Family Medical/ Social History: Changes? No  MENTAL HEALTH EXAM:  There were no vitals taken for this visit.There is no height or weight on file to calculate BMI.  General Appearance: Casual, Neat, and Well Groomed  Eye Contact:  Good  Speech:  Clear and Coherent  Volume:  Normal  Mood:  NA  Affect:  Appropriate  Thought  Process:  Coherent  Orientation:  Full (Time, Place, and Person)  Thought Content: Logical   Suicidal Thoughts:  No  Homicidal Thoughts:  No  Memory:  WNL  Judgement:  Good  Insight:  Good  Psychomotor Activity:  Normal  Concentration:  Concentration: Good  Recall:  Good  Fund of Knowledge: Good  Language: Good  Assets:  Desire for Improvement  ADL's:  Intact  Cognition: WNL  Prognosis:  Good    DIAGNOSES:    ICD-10-CM   1. Generalized anxiety disorder  F41.1 hydrOXYzine  (ATARAX ) 25 MG tablet    fluvoxaMINE  (LUVOX ) 100 MG tablet    nortriptyline  (PAMELOR ) 50 MG capsule    2. Obsessive-compulsive disorder, unspecified type  F42.9 fluvoxaMINE  (LUVOX ) 100 MG tablet    nortriptyline  (PAMELOR ) 50 MG capsule    3. Major depressive disorder, recurrent episode, moderate (HCC)  F33.1 fluvoxaMINE  (LUVOX ) 100 MG tablet    4. Unspecified mood (affective) disorder  F39 nortriptyline  (PAMELOR ) 50 MG capsule    5. Panic attack  F41.0 nortriptyline  (PAMELOR ) 50 MG capsule    6. Insomnia due to other mental disorder  F51.05 traZODone  (DESYREL ) 50 MG tablet   F99       Receiving Psychotherapy: No    RECOMMENDATIONS:   Greater than 50% of  30 min face to face time with patient was spent on counseling and coordination of care. We discussed his continued report of significant improvement and continue stability with current medication regimen. Expresses that he may want to try a slight increase with his Luvox  for his  OCD symptoms. Things are good but he periodically struggles with rumination.     We agreed to: Continue Nortriptyline  50 mg capsule  daily Continue Trazodone  50 mg at bedtime Continue hydroxyzine  25 mg three time daily as needed. Increase  Luvox  to to 150 mg daily To report any worsening symptoms or side effects promptly To follow up in 6 months to reassess  Provided emergency contact information including suicide hotline. He understands he can call 911 or go to the  nearest emergency dept. Continue in therapy with Rolan Faden Discussed potential metabolic side effects associated with atypical antipsychotics, as well as potential risk for movement side effects. Advised pt to contact office if movement side effects occur.      Redell DELENA Pizza, NP

## 2023-11-15 ENCOUNTER — Other Ambulatory Visit: Payer: Self-pay | Admitting: Behavioral Health

## 2023-11-15 DIAGNOSIS — F5105 Insomnia due to other mental disorder: Secondary | ICD-10-CM

## 2024-02-09 ENCOUNTER — Other Ambulatory Visit: Payer: Self-pay | Admitting: Behavioral Health

## 2024-02-09 DIAGNOSIS — F411 Generalized anxiety disorder: Secondary | ICD-10-CM

## 2024-04-23 ENCOUNTER — Ambulatory Visit: Admitting: Behavioral Health
